# Patient Record
Sex: Female | Born: 1965 | Race: White | Hispanic: No | Marital: Married | Smoking: Current every day smoker
Health system: Southern US, Community
[De-identification: ages and names within clinical notes are randomized; demographics above are authoritative.]

## PROBLEM LIST (undated history)

## (undated) DIAGNOSIS — J339 Nasal polyp, unspecified: Secondary | ICD-10-CM

## (undated) DIAGNOSIS — H409 Unspecified glaucoma: Secondary | ICD-10-CM

## (undated) HISTORY — DX: Unspecified glaucoma: H40.9

## (undated) HISTORY — PX: OTHER SURGICAL HISTORY: SHX169

## (undated) HISTORY — DX: Nasal polyp, unspecified: J33.9

---

## 2010-02-14 ENCOUNTER — Ambulatory Visit: Payer: Self-pay

## 2010-02-20 ENCOUNTER — Ambulatory Visit: Payer: Self-pay

## 2014-04-27 ENCOUNTER — Ambulatory Visit: Payer: 59 | Admitting: Podiatry

## 2014-04-27 ENCOUNTER — Encounter: Payer: Self-pay | Admitting: Podiatry

## 2014-04-27 VITALS — BP 123/73 | HR 60 | Resp 16 | Ht 66.0 in | Wt 189.0 lb

## 2014-04-27 DIAGNOSIS — M79609 Pain in unspecified limb: Secondary | ICD-10-CM

## 2014-04-27 DIAGNOSIS — B079 Viral wart, unspecified: Secondary | ICD-10-CM

## 2014-04-27 DIAGNOSIS — Q828 Other specified congenital malformations of skin: Secondary | ICD-10-CM

## 2014-04-27 NOTE — Progress Notes (Signed)
   Subjective:    Patient ID: Kristina Hall, female    DOB: May 05, 1966, 48 y.o.   MRN: [id number]  HPI Comments: i have a lump on my left foot that has been there since oct 2014 and another lump on my rt foot that i just noticed. The left one hurts and the right one does not. The rt one has remained the same. The left one is getting worse. The left one hurts and stings. i put dr scholls wart remover on it.   Foot Pain      Review of Systems  HENT: Positive for sinus pressure.   Allergic/Immunologic: Positive for environmental allergies.  All other systems reviewed and are negative.      Objective:   Physical Exam        Assessment & Plan:

## 2014-04-27 NOTE — Patient Instructions (Signed)

## 2014-04-28 NOTE — Progress Notes (Signed)
Subjective:     Patient ID: Kristina Hall, female   DOB: 07-Mar-1966, 48 y.o.   MRN: [id number]  Foot Pain   patient presents with a painful lump on the outside of the left foot that she states she's tried to trim and pad without relief and a small and on the right is not bothering her currently   Review of Systems  All other systems reviewed and are negative.      Objective:   Physical Exam  Nursing note and vitals reviewed. Constitutional: She is oriented to person, place, and time.  Cardiovascular: Intact distal pulses.   Musculoskeletal: Normal range of motion.  Neurological: She is oriented to person, place, and time.  Skin: Skin is warm.   neurovascular status intact with muscle strength adequate and patient found to have lesion on the outside of the left foot that upon debridement shows pinpoint bleeding and also a lucent core. The right one was very small and was unable to identify any pathology. 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 She understands this and today I infiltrated 60 mg Xylocaine with epinephrine did sterile prep to the area remove the lesion in toto and placed in formalin for pathological evaluation and applied phenol to the base. It did measure about 1.2 cm x 8 mm and was painful when pressed applied sterile dressing and begin soaks

## 2014-06-26 ENCOUNTER — Encounter: Payer: Self-pay | Admitting: Podiatry

## 2014-07-08 ENCOUNTER — Emergency Department: Payer: Self-pay | Admitting: Emergency Medicine

## 2014-07-08 LAB — URINALYSIS, COMPLETE
Bacteria: NONE SEEN
Bilirubin,UR: NEGATIVE
Blood: NEGATIVE
GLUCOSE, UR: NEGATIVE mg/dL (ref 0–75)
Ketone: NEGATIVE
Leukocyte Esterase: NEGATIVE
NITRITE: NEGATIVE
PROTEIN: NEGATIVE
Ph: 6 (ref 4.5–8.0)
RBC,UR: NONE SEEN /HPF (ref 0–5)
SPECIFIC GRAVITY: 1.003 (ref 1.003–1.030)
WBC UR: <1 /HPF (ref 0–5)

## 2014-07-08 LAB — CBC
HCT: 47.8 % — ABNORMAL HIGH (ref 35.0–47.0)
HGB: 15.5 g/dL (ref 12.0–16.0)
MCH: 30.6 pg (ref 26.0–34.0)
MCHC: 32.4 g/dL (ref 32.0–36.0)
MCV: 95 fL (ref 80–100)
PLATELETS: 237 10*3/uL (ref 150–440)
RBC: 5.05 10*6/uL (ref 3.80–5.20)
RDW: 12.8 % (ref 11.5–14.5)
WBC: 8.7 10*3/uL (ref 3.6–11.0)

## 2014-07-08 LAB — COMPREHENSIVE METABOLIC PANEL
ALBUMIN: 3.6 g/dL (ref 3.4–5.0)
ALK PHOS: 78 U/L
Anion Gap: 6 — ABNORMAL LOW (ref 7–16)
BILIRUBIN TOTAL: 0.5 mg/dL (ref 0.2–1.0)
BUN: 17 mg/dL (ref 7–18)
CALCIUM: 9.1 mg/dL (ref 8.5–10.1)
Chloride: 111 mmol/L — ABNORMAL HIGH (ref 98–107)
Co2: 24 mmol/L (ref 21–32)
Creatinine: 0.75 mg/dL (ref 0.60–1.30)
EGFR (Non-African Amer.): >60
Glucose: 89 mg/dL (ref 65–99)
Osmolality: 282 (ref 275–301)
POTASSIUM: 4.1 mmol/L (ref 3.5–5.1)
SGOT(AST): 31 U/L (ref 15–37)
SGPT (ALT): 17 U/L
SODIUM: 141 mmol/L (ref 136–145)
Total Protein: 7.4 g/dL (ref 6.4–8.2)

## 2014-07-08 LAB — LIPASE, BLOOD: LIPASE: 164 U/L (ref 73–393)

## 2014-07-11 ENCOUNTER — Ambulatory Visit: Payer: Self-pay | Admitting: Family Medicine

## 2014-07-18 ENCOUNTER — Encounter: Payer: Self-pay | Admitting: *Deleted

## 2014-07-24 ENCOUNTER — Ambulatory Visit: Payer: 59 | Admitting: General Surgery

## 2014-10-02 ENCOUNTER — Ambulatory Visit (INDEPENDENT_AMBULATORY_CARE_PROVIDER_SITE_OTHER): Payer: 59 | Admitting: Podiatry

## 2014-10-02 VITALS — BP 130/71 | HR 71 | Resp 16

## 2014-10-02 DIAGNOSIS — B078 Other viral warts: Secondary | ICD-10-CM

## 2014-10-02 DIAGNOSIS — B079 Viral wart, unspecified: Secondary | ICD-10-CM

## 2014-10-02 DIAGNOSIS — M779 Enthesopathy, unspecified: Secondary | ICD-10-CM

## 2014-10-02 MED ORDER — TRIAMCINOLONE ACETONIDE 10 MG/ML IJ SUSP
10.0000 mg | Freq: Once | INTRAMUSCULAR | Status: AC
  Administered 2014-10-02: 10 mg

## 2014-10-03 NOTE — Progress Notes (Signed)
Subjective:     Patient ID: Kristina Hall, female   DOB: [birth year], 48 y.o.   MRN: [id number]  HPI patient has inflamed area on the lateral side of the left foot around the fifth metatarsal with keratotic lesion formation and history of having wart excised by me approximately 7 months ago. States it was very good for a few months and then that was reoccurrence   Review of Systems     Objective:   Physical Exam Neurovascular status intact muscle strength adequate with range of motion within normal limits. Patient is noted to have plantar keratotic lesion fifth metatarsal left with fluid buildup underneath the area and upon debridement there is slight pinpoint bleeding but it appears to be more of a waxy core like look    Assessment:     Inflammatory capsulitis left fifth MPJ with either verruca plantaris or possible scar tissue plantar aspect left fifth metatarsal    Plan:     Explain both conditions and today I did a fifth MPJ injection 3 mg dexamethasone Kenalog 5 mg Xylocaine and after appropriate numbness did deep debridement of lesions and applied chemical to kill any remaining verruca cells. Reappoint to recheck again in approximately 3 weeks

## 2014-10-19 ENCOUNTER — Ambulatory Visit (INDEPENDENT_AMBULATORY_CARE_PROVIDER_SITE_OTHER): Payer: 59 | Admitting: Podiatry

## 2014-10-19 ENCOUNTER — Encounter: Payer: Self-pay | Admitting: Podiatry

## 2014-10-19 VITALS — BP 122/72 | HR 68 | Resp 11

## 2014-10-19 DIAGNOSIS — B078 Other viral warts: Secondary | ICD-10-CM

## 2014-10-19 DIAGNOSIS — B079 Viral wart, unspecified: Secondary | ICD-10-CM

## 2014-10-20 NOTE — Progress Notes (Signed)
Subjective:     Patient ID: Kristina Hall, female   DOB: [birth year], 49 y.o.   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 Reappoint to recheck again if symptoms persist

## 2015-02-04 ENCOUNTER — Ambulatory Visit
Admission: RE | Admit: 2015-02-04 | Discharge: 2015-02-04 | Disposition: A | Discharge status: Home / Self Care | Payer: 59 | Attending: Family Medicine | Admitting: Family Medicine

## 2015-02-04 ENCOUNTER — Other Ambulatory Visit: Payer: Self-pay | Admitting: *Deleted

## 2015-02-04 ENCOUNTER — Ambulatory Visit
Admission: RE | Admit: 2015-02-04 | Discharge: 2015-02-04 | Disposition: A | Discharge status: Home / Self Care | Payer: 59 | Source: Ambulatory Visit | Attending: *Deleted | Admitting: *Deleted

## 2015-02-04 DIAGNOSIS — R52 Pain, unspecified: Secondary | ICD-10-CM

## 2015-02-04 DIAGNOSIS — M79644 Pain in right finger(s): Secondary | ICD-10-CM | POA: Insufficient documentation

## 2015-02-05 ENCOUNTER — Other Ambulatory Visit: Payer: Self-pay | Admitting: Family Medicine

## 2015-02-05 DIAGNOSIS — M79644 Pain in right finger(s): Secondary | ICD-10-CM

## 2015-07-26 ENCOUNTER — Ambulatory Visit (INDEPENDENT_AMBULATORY_CARE_PROVIDER_SITE_OTHER): Payer: 59 | Admitting: Family Medicine

## 2015-07-26 ENCOUNTER — Encounter: Payer: Self-pay | Admitting: Family Medicine

## 2015-07-26 VITALS — BP 102/60 | HR 72 | Temp 98.4°F | Resp 16 | Ht 65.0 in | Wt 186.6 lb

## 2015-07-26 DIAGNOSIS — J330 Polyp of nasal cavity: Secondary | ICD-10-CM

## 2015-07-26 DIAGNOSIS — J0121 Acute recurrent ethmoidal sinusitis: Secondary | ICD-10-CM | POA: Diagnosis not present

## 2015-07-26 MED ORDER — AMOXICILLIN-POT CLAVULANATE 875-125 MG PO TABS: 1.0000 | ORAL_TABLET | Freq: Two times a day (BID) | ORAL | Status: DC

## 2015-07-26 MED ORDER — METHYLPREDNISOLONE 4 MG PO TBPK: ORAL_TABLET | ORAL | Status: DC

## 2015-07-26 NOTE — Patient Instructions (Signed)
Follow up with ENT if not improving

## 2015-07-26 NOTE — Progress Notes (Signed)
Subjective:     Patient ID: Kristina Hall, female   DOB: [birth year], 49 y.o.   MRN: [id number]  HPI  Chief Complaint  Patient presents with  . Facial Pain    Patient comes in office today with concerns of sinus pain and pressure on the left side of her face for 1 week. Patient states that after last visit she saw ENT Dr. Tami Ribas who examined her and she was informed she had a polyp on the left side of her nose, she was prescribed a nasal spray to help with inflammation. Patient reports that she is having pain on the left side and feels stopped up she has also taken otc Advil Cold and Sinus and Mucinex D with no relief.   States she has increased dose of Q-Nasl to twice daily (as previously instructed by ENT) with little improvement. Unable to blow drainage out of that side. She is starting a job at [employer] and once she has sick days intends to have ENT surgery. Reports prior facial flushing/burn with prednisone.   Review of Systems  Constitutional: Negative for fever and chills.       Objective:   Physical Exam  Constitutional: She appears well-developed and well-nourished. No distress.  Ears: T.M's intact without inflammation Sinuses: left paranasal tenderness Throat: no tonsillar enlargement or exudate Neck: no cervical adenopathy Lungs: clear     Assessment:    1. Acute recurrent ethmoidal sinusitis - amoxicillin-clavulanate (AUGMENTIN) 875-125 MG tablet; Take 1 tablet by mouth 2 (two) times daily.  Dispense: 20 tablet; Refill: 0 - methylPREDNISolone (MEDROL DOSEPAK) 4 MG TBPK tablet; Taper daily as follows: 4 pills-4 pills-4 pills-3 pills-3 pills-2 pills-one pill  Dispense: 21 tablet; Refill: 1    Plan:    Refer to ENT if not improved.

## 2017-01-07 ENCOUNTER — Ambulatory Visit (INDEPENDENT_AMBULATORY_CARE_PROVIDER_SITE_OTHER): Payer: 59 | Admitting: Family Medicine

## 2017-01-07 ENCOUNTER — Encounter: Payer: Self-pay | Admitting: Family Medicine

## 2017-01-07 VITALS — BP 114/70 | HR 60 | Temp 98.3°F | Resp 16 | Wt 200.2 lb

## 2017-01-07 DIAGNOSIS — R002 Palpitations: Secondary | ICD-10-CM

## 2017-01-07 DIAGNOSIS — R42 Dizziness and giddiness: Secondary | ICD-10-CM | POA: Diagnosis not present

## 2017-01-07 LAB — EKG 12-LEAD

## 2017-01-07 NOTE — Progress Notes (Signed)
Subjective:     Patient ID: Kristina Hall, female   DOB: [birth year], 51 y.o.   MRN: [id number]  HPI  Chief Complaint  Patient presents with  . Palpitations    Patient comes in office today with concerns of heart palpitations for the past several weeks. Patient states in Feb. she began walking daily and she remembers a week and half ago after walking two miles she noticed palpitations and feeling of light headiness. Patient reports that on 3/27 she had a dull headache and dizziness at work, patient states that she was bending down to grab her purse amd when she stood dizziness occured. Patient denies chest pain associated with palpitations.   States she is trying to lose weight as she is sitting down all day at her job. Uses an exercise video. Minimal use of caffeine but smokes 1/2 ppd. She has recorded one episode of palpitations with a phone app. The rate ranged from 60's to 78 lasting a few seconds. States the beat is regular not irregular. No prior EKG's or Lipid profile.   Review of Systems     Objective:   Physical Exam  Constitutional: She appears well-developed and well-nourished. No distress.  Neck: Carotid bruit is not present. No thyromegaly present.  Cardiovascular: Normal rate and regular rhythm.   Pulmonary/Chest: Breath sounds normal.  Musculoskeletal: She exhibits no edema (of lower extremties).  Psychiatric:  Mild anxiety       Assessment:    1. Heart palpitations - EKG 12-Lead - Lipid panel - T4, free - TSH  2. Dizziness - CBC with Differential/Platelet - Comprehensive metabolic panel    Plan:    Further f/u pending lab work. Consider cardiology referral.

## 2017-01-07 NOTE — Patient Instructions (Addendum)
We will call you with the lab work. Calorie Counting for Weight Loss Calories are units of energy. Your body needs a certain amount of calories from food to keep you going throughout the day. When you eat more calories than your body needs, your body stores the extra calories as fat. When you eat fewer calories than your body needs, your body burns fat to get the energy it needs. Calorie counting means keeping track of how many calories you eat and drink each day. Calorie counting can be helpful if you need to lose weight. If you make sure to eat fewer calories than your body needs, you should lose weight. Ask your health care provider what a healthy weight is for you. For calorie counting to work, you will need to eat the right number of calories in a day in order to lose a healthy amount of weight per week. A dietitian can help you determine how many calories you need in a day and will give you suggestions on how to reach your calorie goal.  A healthy amount of weight to lose per week is usually 1-2 lb (0.5-0.9 kg). This usually means that your daily calorie intake should be reduced by 500-750 calories.  Eating 1,200 - 1,500 calories per day can help most women lose weight.  Eating 1,500 - 1,800 calories per day can help most men lose weight. What is my plan? My goal is to have __________ calories per day. If I have this many calories per day, I should lose around __________ pounds per week. What do I need to know about calorie counting? In order to meet your daily calorie goal, you will need to:  Find out how many calories are in each food you would like to eat. Try to do this before you eat.  Decide how much of the food you plan to eat.  Write down what you ate and how many calories it had. Doing this is called keeping a food log. To successfully lose weight, it is important to balance calorie counting with a healthy lifestyle that includes regular activity. Aim for 150 minutes of moderate  exercise (such as walking) or 75 minutes of vigorous exercise (such as running) each week. Where do I find calorie information?   The number of calories in a food can be found on a Nutrition Facts label. If a food does not have a Nutrition Facts label, try to look up the calories online or ask your dietitian for help. Remember that calories are listed per serving. If you choose to have more than one serving of a food, you will have to multiply the calories per serving by the amount of servings you plan to eat. For example, the label on a package of bread might say that a serving size is 1 slice and that there are 90 calories in a serving. If you eat 1 slice, you will have eaten 90 calories. If you eat 2 slices, you will have eaten 180 calories. How do I keep a food log? Immediately after each meal, record the following information in your food log:  What you ate. Don't forget to include toppings, sauces, and other extras on the food.  How much you ate. This can be measured in cups, ounces, or number of items.  How many calories each food and drink had.  The total number of calories in the meal. Keep your food log near you, such as in a small notebook in your pocket, or use  a mobile app or website. Some programs will calculate calories for you and show you how many calories you have left for the day to meet your goal. What are some calorie counting tips?  Use your calories on foods and drinks that will fill you up and not leave you hungry:  Some examples of foods that fill you up are nuts and nut butters, vegetables, lean proteins, and high-fiber foods like whole grains. High-fiber foods are foods with more than 5 g fiber per serving.  Drinks such as sodas, specialty coffee drinks, alcohol, and juices have a lot of calories, yet do not fill you up.  Eat nutritious foods and avoid empty calories. Empty calories are calories you get from foods or beverages that do not have many vitamins or  protein, such as candy, sweets, and soda. It is better to have a nutritious high-calorie food (such as an avocado) than a food with few nutrients (such as a bag of chips).  Know how many calories are in the foods you eat most often. This will help you calculate calorie counts faster.  Pay attention to calories in drinks. Low-calorie drinks include water and unsweetened drinks.  Pay attention to nutrition labels for "low fat" or "fat free" foods. These foods sometimes have the same amount of calories or more calories than the full fat versions. They also often have added sugar, starch, or salt, to make up for flavor that was removed with the fat.  Find a way of tracking calories that works for you. Get creative. Try different apps or programs if writing down calories does not work for you. What are some portion control tips?  Know how many calories are in a serving. This will help you know how many servings of a certain food you can have.  Use a measuring cup to measure serving sizes. You could also try weighing out portions on a kitchen scale. With time, you will be able to estimate serving sizes for some foods.  Take some time to put servings of different foods on your favorite plates, bowls, and cups so you know what a serving looks like.  Try not to eat straight from a bag or box. Doing this can lead to overeating. Put the amount you would like to eat in a cup or on a plate to make sure you are eating the right portion.  Use smaller plates, glasses, and bowls to prevent overeating.  Try not to multitask (for example, watch TV or use your computer) while eating. If it is time to eat, sit down at a table and enjoy your food. This will help you to know when you are full. It will also help you to be aware of what you are eating and how much you are eating. What are tips for following this plan? Reading food labels   Check the calorie count compared to the serving size. The serving size may be  smaller than what you are used to eating.  Check the source of the calories. Make sure the food you are eating is high in vitamins and protein and low in saturated and trans fats. Shopping   Read nutrition labels while you shop. This will help you make healthy decisions before you decide to purchase your food.  Make a grocery list and stick to it. Cooking   Try to cook your favorite foods in a healthier way. For example, try baking instead of frying.  Use low-fat dairy products. Meal planning  Use more fruits and vegetables. Half of your plate should be fruits and vegetables.  Include lean proteins like poultry and fish. How do I count calories when eating out?  Ask for smaller portion sizes.  Consider sharing an entree and sides instead of getting your own entree.  If you get your own entree, eat only half. Ask for a box at the beginning of your meal and put the rest of your entree in it so you are not tempted to eat it.  If calories are listed on the menu, choose the lower calorie options.  Choose dishes that include vegetables, fruits, whole grains, low-fat dairy products, and lean protein.  Choose items that are boiled, broiled, grilled, or steamed. Stay away from items that are buttered, battered, fried, or served with cream sauce. Items labeled "crispy" are usually fried, unless stated otherwise.  Choose water, low-fat milk, unsweetened iced tea, or other drinks without added sugar. If you want an alcoholic beverage, choose a lower calorie option such as a glass of wine or light beer.  Ask for dressings, sauces, and syrups on the side. These are usually high in calories, so you should limit the amount you eat.  If you want a salad, choose a garden salad and ask for grilled meats. Avoid extra toppings like bacon, cheese, or fried items. Ask for the dressing on the side, or ask for olive oil and vinegar or lemon to use as dressing.  Estimate how many servings of a food you  are given. For example, a serving of cooked rice is  cup or about the size of half a baseball. Knowing serving sizes will help you be aware of how much food you are eating at restaurants. The list below tells you how big or small some common portion sizes are based on everyday objects:  1 oz-4 stacked dice.  3 oz-1 deck of cards.  1 tsp-1 die.  1 Tbsp- a ping-pong ball.  2 Tbsp-1 ping-pong ball.   cup- baseball.  1 cup-1 baseball. Summary  Calorie counting means keeping track of how many calories you eat and drink each day. If you eat fewer calories than your body needs, you should lose weight.  A healthy amount of weight to lose per week is usually 1-2 lb (0.5-0.9 kg). This usually means reducing your daily calorie intake by 500-750 calories.  The number of calories in a food can be found on a Nutrition Facts label. If a food does not have a Nutrition Facts label, try to look up the calories online or ask your dietitian for help.  Use your calories on foods and drinks that will fill you up, and not on foods and drinks that will leave you hungry.  Use smaller plates, glasses, and bowls to prevent overeating. This information is not intended to replace advice given to you by your health care provider. Make sure you discuss any questions you have with your health care provider. Document Released: 09/21/2005 Document Revised: 08/21/2016 Document Reviewed: 08/21/2016 Elsevier Interactive Patient Education  2017 [employer].

## 2017-01-08 ENCOUNTER — Other Ambulatory Visit (INDEPENDENT_AMBULATORY_CARE_PROVIDER_SITE_OTHER): Payer: 59

## 2017-01-08 ENCOUNTER — Other Ambulatory Visit: Payer: Self-pay

## 2017-01-08 DIAGNOSIS — R002 Palpitations: Secondary | ICD-10-CM

## 2017-01-08 DIAGNOSIS — R42 Dizziness and giddiness: Secondary | ICD-10-CM

## 2017-01-09 LAB — COMPREHENSIVE METABOLIC PANEL
A/G RATIO: 2 (ref 1.2–2.2)
ALBUMIN: 4.5 g/dL (ref 3.5–5.5)
ALT: 9 IU/L (ref 0–32)
AST: 16 IU/L (ref 0–40)
Alkaline Phosphatase: 83 IU/L (ref 39–117)
BUN/Creatinine Ratio: 20 (ref 9–23)
BUN: 17 mg/dL (ref 6–24)
Bilirubin Total: 0.4 mg/dL (ref 0.0–1.2)
CALCIUM: 9.5 mg/dL (ref 8.7–10.2)
CO2: 24 mmol/L (ref 18–29)
CREATININE: 0.84 mg/dL (ref 0.57–1.00)
Chloride: 100 mmol/L (ref 96–106)
GFR, EST AFRICAN AMERICAN: 94 mL/min/{1.73_m2} (ref >59)
GFR, EST NON AFRICAN AMERICAN: 81 mL/min/{1.73_m2} (ref >59)
GLOBULIN, TOTAL: 2.3 g/dL (ref 1.5–4.5)
Glucose: 97 mg/dL (ref 65–99)
POTASSIUM: 4.2 mmol/L (ref 3.5–5.2)
Sodium: 142 mmol/L (ref 134–144)
TOTAL PROTEIN: 6.8 g/dL (ref 6.0–8.5)

## 2017-01-09 LAB — CBC WITH DIFFERENTIAL/PLATELET
Basophils Absolute: 0 10*3/uL (ref 0.0–0.2)
Basos: 0 %
EOS (ABSOLUTE): 0.1 10*3/uL (ref 0.0–0.4)
EOS: 1 %
HEMOGLOBIN: 15.3 g/dL (ref 11.1–15.9)
Hematocrit: 45 % (ref 34.0–46.6)
IMMATURE GRANS (ABS): 0 10*3/uL (ref 0.0–0.1)
IMMATURE GRANULOCYTES: 0 %
LYMPHS: 26 %
Lymphocytes Absolute: 2 10*3/uL (ref 0.7–3.1)
MCH: 30.7 pg (ref 26.6–33.0)
MCHC: 34 g/dL (ref 31.5–35.7)
MCV: 90 fL (ref 79–97)
MONOCYTES: 8 %
Monocytes Absolute: 0.6 10*3/uL (ref 0.1–0.9)
NEUTROS PCT: 65 %
Neutrophils Absolute: 5 10*3/uL (ref 1.4–7.0)
PLATELETS: 235 10*3/uL (ref 150–379)
RBC: 4.98 x10E6/uL (ref 3.77–5.28)
RDW: 12.4 % (ref 12.3–15.4)
WBC: 7.7 10*3/uL (ref 3.4–10.8)

## 2017-01-09 LAB — T4, FREE: Free T4: 1.32 ng/dL (ref 0.82–1.77)

## 2017-01-09 LAB — TSH: TSH: 2.34 u[IU]/mL (ref 0.450–4.500)

## 2017-01-09 LAB — LIPID PANEL
CHOLESTEROL TOTAL: 184 mg/dL (ref 100–199)
Chol/HDL Ratio: 2.6 ratio (ref 0.0–4.4)
HDL: 72 mg/dL (ref >39)
LDL CALC: 102 mg/dL — AB (ref 0–99)
Triglycerides: 48 mg/dL (ref 0–149)
VLDL CHOLESTEROL CAL: 10 mg/dL (ref 5–40)

## 2017-08-27 ENCOUNTER — Ambulatory Visit: Payer: 59 | Admitting: Family Medicine

## 2017-08-27 ENCOUNTER — Encounter: Payer: Self-pay | Admitting: Family Medicine

## 2017-08-27 VITALS — BP 110/80 | HR 62 | Temp 98.1°F | Resp 16 | Wt 197.0 lb

## 2017-08-27 DIAGNOSIS — R0981 Nasal congestion: Secondary | ICD-10-CM | POA: Diagnosis not present

## 2017-08-27 DIAGNOSIS — J330 Polyp of nasal cavity: Secondary | ICD-10-CM | POA: Diagnosis not present

## 2017-08-27 MED ORDER — BECLOMETHASONE DIPROPIONATE 80 MCG/ACT NA AERS: 2.0000 | INHALATION_SPRAY | NASAL | 5 refills | Status: DC | PRN

## 2017-08-27 MED ORDER — METHYLPREDNISOLONE 4 MG PO TABS: ORAL_TABLET | ORAL | 1 refills | Status: DC

## 2017-08-27 NOTE — Progress Notes (Signed)
Subjective:     Patient ID: Kristina Hall, female   DOB: April 28, 1966, 51 y.o.   MRN: [id number] Chief Complaint  Patient presents with  . Nasal Congestion    Patient here today C/O nasl congestion, pt reports she does have a history of nasal polyp. Patient reports she has been seen at ENT.   Reports transient relief of left nasal obstruction with Tea Tree oil. Has run out of steroid nasal spray. Reports drainage is clear. Continues to smoke 0.5 ppd. HPI   Review of Systems     Objective:   Physical Exam  Constitutional: She appears well-developed and well-nourished. No distress.  HENT:  Left nasal turbinates swollen with clear drainage noted.       Assessment:    1. Nasal polyp, posterior  2. Nasal sinus congestion: refilled steroid nasal spray and Medrol dose pak.    Plan:    Call if needs referral back to ENT.

## 2017-08-27 NOTE — Patient Instructions (Signed)
Let me know if you need referral back to Dr. Tami Ribas.

## 2017-09-01 ENCOUNTER — Telehealth: Payer: Self-pay | Admitting: Family Medicine

## 2017-09-01 ENCOUNTER — Other Ambulatory Visit: Payer: Self-pay | Admitting: Family Medicine

## 2017-09-01 DIAGNOSIS — J329 Chronic sinusitis, unspecified: Secondary | ICD-10-CM

## 2017-09-01 MED ORDER — AMOXICILLIN-POT CLAVULANATE 875-125 MG PO TABS: 1.0000 | ORAL_TABLET | Freq: Two times a day (BID) | ORAL | 0 refills | Status: DC

## 2017-09-01 NOTE — Telephone Encounter (Signed)
Please review note and advise. KW

## 2017-09-01 NOTE — Telephone Encounter (Signed)
Pt states she seen Mikki Santee on Friday.  Pt states she is now having thick yellow/green mucus coming from the left side of her nose and having some pain.  Pt is also some chest congestion and a wet cough.  Pt states she does not need a cough medication but is requesting an antibiotic to help with the mucus [facility].  CB#[phone]/MW

## 2017-09-01 NOTE — Telephone Encounter (Signed)
Left message advising patient.KW

## 2017-09-01 NOTE — Telephone Encounter (Signed)
Let her know I sent in Augmentin.

## 2018-01-14 ENCOUNTER — Other Ambulatory Visit: Payer: Self-pay | Admitting: Family Medicine

## 2018-01-14 DIAGNOSIS — J330 Polyp of nasal cavity: Secondary | ICD-10-CM

## 2018-01-14 MED ORDER — FLUTICASONE PROPIONATE 50 MCG/ACT NA SUSP: NASAL | 6 refills | Status: DC

## 2018-09-26 ENCOUNTER — Telehealth: Payer: Self-pay | Admitting: Family Medicine

## 2018-09-26 NOTE — Telephone Encounter (Signed)
[facility] faxed refill request for the following medications:  QNASL 80 MCG/ACT AERS   Last dispensed:  08/25/2018  Date written: 08/27/2017  Please advise.

## 2018-09-27 ENCOUNTER — Other Ambulatory Visit: Payer: Self-pay | Admitting: Family Medicine

## 2018-09-27 MED ORDER — BECLOMETHASONE DIPROPIONATE 80 MCG/ACT NA AERS: INHALATION_SPRAY | NASAL | 5 refills | Status: DC

## 2018-09-27 NOTE — Telephone Encounter (Signed)
Sent in. Previously using fluticasone

## 2018-09-27 NOTE — Telephone Encounter (Signed)
Please review.  dbs 

## 2019-03-10 ENCOUNTER — Ambulatory Visit (INDEPENDENT_AMBULATORY_CARE_PROVIDER_SITE_OTHER): Payer: 59 | Admitting: Family Medicine

## 2019-03-10 ENCOUNTER — Other Ambulatory Visit: Payer: Self-pay

## 2019-03-10 ENCOUNTER — Encounter: Payer: Self-pay | Admitting: Family Medicine

## 2019-03-10 VITALS — BP 118/68 | HR 69 | Temp 98.2°F | Resp 18

## 2019-03-10 DIAGNOSIS — L237 Allergic contact dermatitis due to plants, except food: Secondary | ICD-10-CM | POA: Diagnosis not present

## 2019-03-10 MED ORDER — METHYLPREDNISOLONE ACETATE 40 MG/ML IJ SUSP
40.0000 mg | Freq: Once | INTRAMUSCULAR | Status: AC
  Administered 2019-03-10: 40 mg via INTRAMUSCULAR

## 2019-03-10 MED ORDER — FAMOTIDINE 40 MG PO TABS: 40.0000 mg | ORAL_TABLET | Freq: Every day | ORAL | 0 refills | Status: DC

## 2019-03-10 MED ORDER — PREDNISONE 10 MG (21) PO TBPK: ORAL_TABLET | ORAL | 0 refills | Status: DC

## 2019-03-10 MED ORDER — METHYLPREDNISOLONE ACETATE 40 MG/ML IJ SUSP
40.0000 mg | Freq: Once | INTRAMUSCULAR | Status: AC
  Administered 2019-03-10: 12:00:00 40 mg via INTRAMUSCULAR

## 2019-03-10 MED ORDER — TRIAMCINOLONE ACETONIDE 0.1 % EX CREA: 1.0000 "application " | TOPICAL_CREAM | Freq: Two times a day (BID) | CUTANEOUS | 0 refills | Status: DC

## 2019-03-10 NOTE — Patient Instructions (Signed)
Also take a 10 mg claritin (loratadine) in the AM to help complete histamine blockade in combo with pepcid at bedtime   Poison Ivy Dermatitis  Poison ivy dermatitis is redness and soreness (inflammation) of the skin. It is caused by a chemical that is found on the leaves of the poison ivy plant. You may also have itching, a rash, and blisters. Symptoms often clear up in 1-2 weeks. You may get this condition by touching a poison ivy plant. You can also get it by touching something that has the chemical on it. This may include animals or objects that have come in contact with the plant. Follow these instructions at home: General instructions  Take or apply over-the-counter and prescription medicines only as told by your doctor.  If you touch poison ivy, wash your skin with soap and cold water right away.  Use hydrocortisone creams or calamine lotion as needed to help with itching.  Take oatmeal baths as needed. Use colloidal oatmeal. You can get this at a pharmacy or grocery store. Follow the instructions on the package.  Do not scratch or rub your skin.  While you have the rash, wash your clothes right after you wear them. Prevention   Know what poison ivy looks like so you can avoid it. This plant has three leaves with flowering branches on a single stem. The leaves are glossy. They have uneven edges that come to a point at the front.  If you have touched poison ivy, wash with soap and water right away. Be sure to wash under your fingernails.  When hiking or camping, wear long pants, a long-sleeved shirt, tall socks, and hiking boots. You can also use a lotion on your skin that helps to prevent contact with the chemical on the plant.  If you think that your clothes or outdoor gear came in contact with poison ivy, rinse them off with a garden hose before you bring them inside your house. Contact a doctor if:  You have open sores in the rash area.  You have more redness, swelling, or  pain in the affected area.  You have redness that spreads beyond the rash area.  You have fluid, blood, or pus coming from the affected area.  You have a fever.  You have a rash over a large area of your body.  You have a rash on your eyes, mouth, or genitals.  Your rash does not get better after a few days. Get help right away if:  Your face swells or your eyes swell shut.  You have trouble breathing.  You have trouble swallowing. This information is not intended to replace advice given to you by your health care provider. Make sure you discuss any questions you have with your health care provider. Document Released: 10/24/2010 Document Revised: 06/15/2018 Document Reviewed: 02/27/2015 Elsevier Interactive Patient Education  2019 [employer].

## 2019-03-10 NOTE — Progress Notes (Signed)
Subjective:    Patient ID: Kristina Hall, female    DOB: 04-04-1966, 53 y.o.   MRN: [id number]  HPI   Patient presents to clinic to establish with PCP and also complains of a rash that she suspects is from poison ivy.  Patient's previous PCP is retiring.  She does not take any medications chronically and has no major medical issues.  Patient was outdoors doing yard work and believes that she did come into contact with poison ivy.  States it was hot and does remember wiping her face that this is why she believes she has a poison ivy rash on her face as well as on her hands and ankles.  States the rash is very itchy and seems to be spreading.  Patient did take Benadryl last night & used calamine lotion with some effect in improving rash, but it is still very itchy and seeming to spread.  Patient Active Problem List   Diagnosis Date Noted  . Nasal polyp, posterior 07/26/2015   Social History   Tobacco Use  . Smoking status: Current Every Day Smoker  . Smokeless tobacco: Never Used  Substance Use Topics  . Alcohol use: No    Review of Systems  Constitutional: Negative for chills, fatigue and fever.  HENT: Negative for congestion, ear pain, sinus pain and sore throat.   Eyes: Negative.   Respiratory: Negative for cough, shortness of breath and wheezing.   Cardiovascular: Negative for chest pain, palpitations and leg swelling.  Gastrointestinal: Negative for abdominal pain, diarrhea, nausea and vomiting.  Genitourinary: Negative for dysuria, frequency and urgency.  Musculoskeletal: Negative for arthralgias and myalgias.  Skin: +rash on ankles, wrists and chin Neurological: Negative for syncope, light-headedness and headaches.  Psychiatric/Behavioral: The patient is not nervous/anxious.       Objective:   Physical Exam Vitals signs and nursing note reviewed.  Constitutional:      General: She is not in acute distress.    Appearance: She is not ill-appearing,  toxic-appearing or diaphoretic.  HENT:     Head: Normocephalic and atraumatic.  Neck:     Musculoskeletal: Normal range of motion and neck supple. No neck rigidity.  Cardiovascular:     Rate and Rhythm: Normal rate and regular rhythm.  Pulmonary:     Effort: Pulmonary effort is normal. No respiratory distress.     Breath sounds: Normal breath sounds.  Skin:    General: Skin is warm and dry.     Findings: Rash (Scattered rash along jawline consistent with patient wiping face, rash on wrists/forearms and also both ankles.  Rash appears red and raised with some dry flaking skin patches and does look consistent with a poison ivy contact dermatitis.) present.  Neurological:     General: No focal deficit present.     Mental Status: She is alert and oriented to person, place, and time.  Psychiatric:        Mood and Affect: Mood normal.        Behavior: Behavior normal.    Vitals:   03/10/19 1100  BP: 118/68  Pulse: 69  Resp: 18  Temp: 98.2 F (36.8 C)     Assessment & Plan:    Contact dermatitis due to poison ivy-patient will take oral steroid taper.  She will use topical triamcinolone cream.  She will also do histamine blockade by taking Claritin 10 mg daily in the a.m. and Pepcid 40 mg in the p.m. to help reduce itching.  Patient also given  IM methylprednisolone in the clinic to help jump start steroid dosing in hopes of calming down inflammation and rash.  Administrations This Visit    methylPREDNISolone acetate (DEPO-MEDROL) injection 40 mg    Admin Date 03/10/2019 Action Given Dose 40 mg Route Intramuscular Administered By Neta Ehlers, RMA        Admin Date 03/10/2019 Action Given Dose 40 mg Route Intramuscular Administered By Neta Ehlers, RMA          Patient will schedule complete physical exam at some point this calendar year.  She is aware she can return to clinic anytime if issues arise.

## 2019-10-25 ENCOUNTER — Encounter: Payer: Self-pay | Admitting: Emergency Medicine

## 2019-10-25 ENCOUNTER — Other Ambulatory Visit: Payer: Self-pay

## 2019-10-25 ENCOUNTER — Emergency Department: Payer: 59

## 2019-10-25 ENCOUNTER — Emergency Department
Admission: EM | Admit: 2019-10-25 | Discharge: 2019-10-25 | Disposition: A | Discharge status: Home / Self Care | Payer: 59 | Attending: Emergency Medicine | Admitting: Emergency Medicine

## 2019-10-25 DIAGNOSIS — R42 Dizziness and giddiness: Secondary | ICD-10-CM | POA: Diagnosis not present

## 2019-10-25 DIAGNOSIS — Z5321 Procedure and treatment not carried out due to patient leaving prior to being seen by health care provider: Secondary | ICD-10-CM | POA: Diagnosis not present

## 2019-10-25 LAB — BASIC METABOLIC PANEL
Anion gap: 9 (ref 5–15)
BUN: 18 mg/dL (ref 6–20)
CO2: 26 mmol/L (ref 22–32)
Calcium: 9.1 mg/dL (ref 8.9–10.3)
Chloride: 106 mmol/L (ref 98–111)
Creatinine, Ser: 0.73 mg/dL (ref 0.44–1.00)
GFR calc Af Amer: >60 mL/min (ref >60)
GFR calc non Af Amer: >60 mL/min (ref >60)
Glucose, Bld: 92 mg/dL (ref 70–99)
Potassium: 3.7 mmol/L (ref 3.5–5.1)
Sodium: 141 mmol/L (ref 135–145)

## 2019-10-25 LAB — CBC
HCT: 47.2 % — ABNORMAL HIGH (ref 36.0–46.0)
Hemoglobin: 15.6 g/dL — ABNORMAL HIGH (ref 12.0–15.0)
MCH: 30.1 pg (ref 26.0–34.0)
MCHC: 33.1 g/dL (ref 30.0–36.0)
MCV: 90.9 fL (ref 80.0–100.0)
Platelets: 225 10*3/uL (ref 150–400)
RBC: 5.19 MIL/uL — ABNORMAL HIGH (ref 3.87–5.11)
RDW: 12.4 % (ref 11.5–15.5)
WBC: 7.4 10*3/uL (ref 4.0–10.5)
nRBC: 0 % (ref 0.0–0.2)

## 2019-10-25 LAB — TROPONIN I (HIGH SENSITIVITY): Troponin I (High Sensitivity): <2 ng/L (ref <18)

## 2019-10-25 NOTE — ED Triage Notes (Signed)
Patient reports she was at work this morning, and got very light headed and had trouble breathing through her mask. 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 Patient denies CP, states chest feels heavy and she is dizzy.

## 2019-10-26 ENCOUNTER — Telehealth: Payer: Self-pay | Admitting: Emergency Medicine

## 2019-10-26 NOTE — Telephone Encounter (Signed)
Called patient due to lwot to inquire about condition and follow up plans. Left message.   

## 2019-10-31 ENCOUNTER — Ambulatory Visit: Payer: 59 | Admitting: Internal Medicine

## 2019-11-07 ENCOUNTER — Ambulatory Visit (INDEPENDENT_AMBULATORY_CARE_PROVIDER_SITE_OTHER): Payer: 59 | Admitting: Internal Medicine

## 2019-11-07 ENCOUNTER — Encounter: Payer: Self-pay | Admitting: Internal Medicine

## 2019-11-07 ENCOUNTER — Other Ambulatory Visit: Payer: Self-pay

## 2019-11-07 VITALS — BP 122/70 | HR 64 | Resp 14 | Ht 66.0 in | Wt 199.0 lb

## 2019-11-07 DIAGNOSIS — D751 Secondary polycythemia: Secondary | ICD-10-CM | POA: Diagnosis not present

## 2019-11-07 DIAGNOSIS — R001 Bradycardia, unspecified: Secondary | ICD-10-CM | POA: Diagnosis not present

## 2019-11-07 DIAGNOSIS — E785 Hyperlipidemia, unspecified: Secondary | ICD-10-CM

## 2019-11-07 NOTE — Progress Notes (Signed)
Subjective:  Patient ID: Kristina Hall, female    DOB: [birth year]  Age: 54 y.o. MRN: [id number]  CC: The primary encounter diagnosis was Bradycardia. Diagnoses of Erythrocytosis, Hyperlipidemia, unspecified hyperlipidemia type, and Sinus bradycardia were also pertinent to this visit.  HPI Kristina Hall presents for  ER follow up. She is a 54 yr old female with no cardiac history who presents with a recent ER visit for symptomatic sinus bradycardia  .  symptoms of light headedness  And ethmoidal headache   Foggy feeling,  Thought it was the mask , went outside to get some air ,  No change.  BP checked , pulse was 42 and irregular , rechecked and 36 Sent to ER.  Lytes oK,  Symptoms of light headedness lasted a few more days but progresively improved before esolving  Has been checking pulse at home average pulse is 56   Has reduced tobacco from 1/2 pack to 1/3 daily .  Motivated to quit bc she has a new grandson Has comitted to losign weight but right knee preventing more than walking.  1329 cal daily  coffee and water only intake. Has lost 3 lbs   left sided back pain radiates to buttock first episode in years.  Has been present for 2 days.  Worse at night   History of pulse 31 after hysterectomy    History Annya has a past medical history of Nasal polyp.   She has a past surgical history that includes supracervical hysterectomy.   Her family history includes Congestive Heart Failure in her paternal grandmother; Congestive Heart Failure (age of onset: 38) in her father; Coronary artery disease in her paternal uncle; Hypotension in her mother.She reports that she has been smoking. She has been smoking about 0.30 packs per day. She has never used smokeless tobacco. She reports current alcohol use of about 1.0 standard drinks of alcohol per week. No history on file for drug.  Outpatient Medications Prior to Visit  Medication Sig Dispense Refill  . amoxicillin-clavulanate  (AUGMENTIN) 875-125 MG tablet Take 1 tablet by mouth 2 (two) times daily. (Patient not taking: Reported on 11/07/2019) 20 tablet 0  . Beclomethasone Dipropionate 80 MCG/ACT AERS One spray each nostril daily (Patient not taking: Reported on 11/07/2019) 10.6 g 5  . famotidine (PEPCID) 40 MG tablet Take 1 tablet (40 mg total) by mouth at bedtime for 14 days. 14 tablet 0  . methylPREDNISolone (MEDROL) 4 MG tablet 4 pills x 3 days, 3 pills x 2 days, two pills x one day, one pill x one days 21 tablet 1  . predniSONE (STERAPRED UNI-PAK 21 TAB) 10 MG (21) TBPK tablet Take according to pack instructions 21 tablet 0  . triamcinolone cream (KENALOG) 0.1 % Apply 1 application topically 2 (two) times daily. Apply to affected rash areas 30 g 0  . VYZULTA 0.024 % SOLN INSTILL 1 DROP INTO BOTH EYES AT BEDTIME AS DIRECTED  7   No facility-administered medications prior to visit.    Review of Systems:  Patient denies headache, fevers, malaise, unintentional weight loss, skin rash, eye pain, sinus congestion and sinus pain, sore throat, dysphagia,  hemoptysis , cough, dyspnea, wheezing, chest pain, palpitations, orthopnea, edema, abdominal pain, nausea, melena, diarrhea, constipation, flank pain, dysuria, hematuria, urinary  Frequency, nocturia, numbness, tingling, seizures,  Focal weakness, Loss of consciousness,  Tremor, insomnia, depression, anxiety, and suicidal ideation.     Objective:  BP 122/70 (BP Location: Left Arm, Patient Position: Sitting, Cuff Size:  Normal)   Pulse 64   Resp 14   Ht 5\' 6"  (1.676 m)   Wt 199 lb (90.3 kg)   SpO2 99%   BMI 32.12 kg/m   Physical Exam:  General appearance: alert, cooperative and appears stated age Ears: normal TM's and external ear canals both ears Throat: lips, mucosa, and tongue normal; teeth and gums normal Neck: no adenopathy, no carotid bruit, supple, symmetrical, trachea midline and thyroid not enlarged, symmetric, no tenderness/mass/nodules Back: symmetric,  no curvature. ROM normal. No CVA tenderness. Lungs: clear to auscultation bilaterally Heart: bradycardic, regular rhythm, S1, S2 normal, no murmur, click, rub or gallop Abdomen: soft, non-tender; bowel sounds normal; no masses,  no organomegaly Pulses: 2+ and symmetric Skin: Skin color, texture, turgor normal. No rashes or lesions Lymph nodes: Cervical, supraclavicular, and axillary nodes normal.   Assessment & Plan:   Problem List Items Addressed This Visit      Unprioritized   Sinus bradycardia    Symptomatic episode resulting in light headedness .  EKG ruled out NSTEMI with one troponin but did not check thyroid function  Or magnesium , will check today and order cardiology evaluation with monitor       Relevant Orders   Ambulatory referral to Cardiology    Other Visit Diagnoses    Bradycardia    -  Primary   Relevant Orders   TSH   Magnesium   Erythrocytosis       Relevant Orders   CBC with Differential/Platelet   Hyperlipidemia, unspecified hyperlipidemia type       Relevant Orders   Lipid panel      I have discontinued Lydia Guiles. Hall's Vyzulta, methylPREDNISolone, amoxicillin-clavulanate, Beclomethasone Dipropionate, predniSONE, triamcinolone cream, and famotidine.  No orders of the defined types were placed in this encounter.   Medications Discontinued During This Encounter  Medication Reason  . amoxicillin-clavulanate (AUGMENTIN) 875-125 MG tablet Completed Course  . Beclomethasone Dipropionate 80 MCG/ACT AERS Patient has not taken in last 30 days  . famotidine (PEPCID) 40 MG tablet Patient has not taken in last 30 days  . methylPREDNISolone (MEDROL) 4 MG tablet Completed Course  . predniSONE (STERAPRED UNI-PAK 21 TAB) 10 MG (21) TBPK tablet Completed Course  . triamcinolone cream (KENALOG) 0.1 % Patient has not taken in last 30 days  . VYZULTA 0.024 % SOLN Patient has not taken in last 30 days   A total of 45 minutes was spent with patient more than half  of which was spent in counseling patient on the above mentioned issues , reviewing and explaining recent labs and imaging studies done, and coordination of care.  Follow-up: No follow-ups on file.   Crecencio Mc, MD

## 2019-11-07 NOTE — Assessment & Plan Note (Signed)
Symptomatic episode resulting in light headedness .  EKG ruled out NSTEMI with one troponin but did not check thyroid function  Or magnesium , will check today and order cardiology evaluation with monitor

## 2019-11-08 LAB — LIPID PANEL
Cholesterol: 186 mg/dL (ref 0–200)
HDL: 69 mg/dL (ref >39.00)
LDL Cholesterol: 110 mg/dL — ABNORMAL HIGH (ref 0–99)
NonHDL: 117.43
Total CHOL/HDL Ratio: 3
Triglycerides: 35 mg/dL (ref 0.0–149.0)
VLDL: 7 mg/dL (ref 0.0–40.0)

## 2019-11-08 LAB — CBC WITH DIFFERENTIAL/PLATELET
Basophils Absolute: 0.1 10*3/uL (ref 0.0–0.1)
Basophils Relative: 1.2 % (ref 0.0–3.0)
Eosinophils Absolute: 0.1 10*3/uL (ref 0.0–0.7)
Eosinophils Relative: 1 % (ref 0.0–5.0)
HCT: 45.4 % (ref 36.0–46.0)
Hemoglobin: 15.1 g/dL — ABNORMAL HIGH (ref 12.0–15.0)
Lymphocytes Relative: 27.8 % (ref 12.0–46.0)
Lymphs Abs: 1.8 10*3/uL (ref 0.7–4.0)
MCHC: 33.2 g/dL (ref 30.0–36.0)
MCV: 91.9 fl (ref 78.0–100.0)
Monocytes Absolute: 0.5 10*3/uL (ref 0.1–1.0)
Monocytes Relative: 7.2 % (ref 3.0–12.0)
Neutro Abs: 4 10*3/uL (ref 1.4–7.7)
Neutrophils Relative %: 62.8 % (ref 43.0–77.0)
Platelets: 205 10*3/uL (ref 150.0–400.0)
RBC: 4.94 Mil/uL (ref 3.87–5.11)
RDW: 13.2 % (ref 11.5–15.5)
WBC: 6.4 10*3/uL (ref 4.0–10.5)

## 2019-11-08 LAB — TSH: TSH: 2.09 u[IU]/mL (ref 0.35–4.50)

## 2019-11-08 LAB — MAGNESIUM: Magnesium: 2.3 mg/dL (ref 1.5–2.5)

## 2019-11-08 NOTE — Addendum Note (Signed)
Addended by: Leeanne Rio on: 11/08/2019 08:49 AM   Modules accepted: Orders

## 2019-11-09 ENCOUNTER — Other Ambulatory Visit: Payer: Self-pay

## 2019-11-09 ENCOUNTER — Ambulatory Visit (INDEPENDENT_AMBULATORY_CARE_PROVIDER_SITE_OTHER): Payer: 59 | Admitting: Cardiology

## 2019-11-09 ENCOUNTER — Encounter: Payer: Self-pay | Admitting: Cardiology

## 2019-11-09 ENCOUNTER — Ambulatory Visit (INDEPENDENT_AMBULATORY_CARE_PROVIDER_SITE_OTHER): Payer: 59

## 2019-11-09 VITALS — BP 128/70 | HR 53 | Ht 66.0 in | Wt 201.2 lb

## 2019-11-09 DIAGNOSIS — R001 Bradycardia, unspecified: Secondary | ICD-10-CM | POA: Diagnosis not present

## 2019-11-09 DIAGNOSIS — R009 Unspecified abnormalities of heart beat: Secondary | ICD-10-CM | POA: Diagnosis not present

## 2019-11-09 DIAGNOSIS — F17200 Nicotine dependence, unspecified, uncomplicated: Secondary | ICD-10-CM | POA: Diagnosis not present

## 2019-11-09 NOTE — Progress Notes (Signed)
Cardiology Office Note:    Date:  11/09/2019   ID:  Kristina Hall, DOB August 14, 1966, MRN [id number]  PCP:  Crecencio Mc, MD  Cardiologist:  No primary care provider on file.  Electrophysiologist:  None   Referring MD: Crecencio Mc, MD   Chief Complaint  Patient presents with  . New Patient (Initial Visit)    Referred by PCP for Low HR. Meds reviewed verbally with patient.    Kristina Hall is a 54 y.o. female who is being seen today for the evaluation of the bradycardia at the request of Derrel Nip, Aris Everts, MD.   History of Present Illness:    Kristina Hall is a 54 y.o. female current smoker, being seen due to low heart rates.  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  While at work roughly 2 to 3 weeks ago, she felt dizzy.  She attributed it to wearing a new mask due to the pandemic and was not breathing well.  Her blood pressure was checked in the office which was normal.  However her heart rate was noted to be 42.  A manual heart recheck revealed heart rate of 36 and pulse was felt to be irregular.  She was then advised to go to the emergency room.  EKG showed sinus bradycardia with heart rate 49, other labs and blood work including high-sensitivity troponin was normal.  She denies any chest pain, shortness of breath, palpitations, presyncope or syncope.  The episode of dizziness have since resolved.  She denies taking any AV nodal blocking agents.  She states having a low heart rate at baseline in the 50s.  Past Medical History:  Diagnosis Date  . Nasal polyp     Past Surgical History:  Procedure Laterality Date  . supracervical hysterectomy     due to menorrhagia     Current Medications: No outpatient medications have been marked as taking for the 11/09/19 encounter (Office Visit) with Kate Sable, MD.     Allergies:   Patient has no known allergies.   Social History   Socioeconomic History  . Marital status:  Married    Spouse name: Not on file  . Number of children: Not on file  . Years of education: Not on file  . Highest education level: Not on file  Occupational History  . Not on file  Tobacco Use  . Smoking status: Current Every Day Smoker    Packs/day: 0.25  . Smokeless tobacco: Never Used  . Tobacco comment: 6 ciggs a day  Substance and Sexual Activity  . Alcohol use: Yes    Alcohol/week: 1.0 standard drinks    Types: 1 Glasses of wine per week  . Drug use: Never  . Sexual activity: Not on file  Other Topics Concern  . Not on file  Social History Narrative   2 childrens aged 34 and 85, both in [place] married   Social Determinants of Radio broadcast assistant Strain:   . Difficulty of Paying Living Expenses: Not on file  Food Insecurity:   . Worried About Charity fundraiser in the Last Year: Not on file  . Ran Out of Food in the Last Year: Not on file  Transportation Needs:   . Lack of Transportation (Medical): Not on file  . Lack of Transportation (Non-Medical): Not on file  Physical Activity:   . Days of Exercise per Week: Not on file  . Minutes  of Exercise per Session: Not on file  Stress:   . Feeling of Stress : Not on file  Social Connections:   . Frequency of Communication with Friends and Family: Not on file  . Frequency of Social Gatherings with Friends and Family: Not on file  . Attends Religious Services: Not on file  . Active Member of Clubs or Organizations: Not on file  . Attends Archivist Meetings: Not on file  . Marital Status: Not on file     Family History: The patient's family history includes Congestive Heart Failure in her paternal grandmother; Congestive Heart Failure (age of onset: 21) in her father; Coronary artery disease in her paternal uncle; Hypotension in her mother.  ROS:   Please see the history of present illness.     All other systems reviewed and are negative.  EKGs/Labs/Other Studies Reviewed:    The  following studies were reviewed today:   EKG:  EKG is  ordered today.  The ekg ordered today demonstrates bradycardia, heart rate 53  Recent Labs: 10/25/2019: BUN 18; Creatinine, Ser 0.73; Potassium 3.7; Sodium 141 11/08/2019: Hemoglobin 15.1; Magnesium 2.3; Platelets 205.0; TSH 2.09  Recent Lipid Panel    Component Value Date/Time   CHOL 186 11/08/2019 0849   CHOL 184 01/08/2017 0000   TRIG 35.0 11/08/2019 0849   HDL 69.00 11/08/2019 0849   HDL 72 01/08/2017 0000   CHOLHDL 3 11/08/2019 0849   VLDL 7.0 11/08/2019 0849   LDLCALC 110 (H) 11/08/2019 0849   LDLCALC 102 (H) 01/08/2017 0000    Physical Exam:    VS:  BP 128/70 (BP Location: Right Arm, Patient Position: Sitting, Cuff Size: Normal)   Pulse (!) 53   Ht 5\' 6"  (1.676 m)   Wt 201 lb 4 oz (91.3 kg)   SpO2 98%   BMI 32.48 kg/m     Wt Readings from Last 3 Encounters:  11/09/19 201 lb 4 oz (91.3 kg)  11/07/19 199 lb (90.3 kg)  08/27/17 197 lb (89.4 kg)     GEN:  Well nourished, well developed in no acute distress HEENT: Normal NECK: No JVD; No carotid bruits LYMPHATICS: No lymphadenopathy CARDIAC: RRR, no murmurs, rubs, gallops RESPIRATORY:  Clear to auscultation without rales, wheezing or rhonchi  ABDOMEN: Soft, non-tender, non-distended MUSCULOSKELETAL:  No edema; No deformity  SKIN: Warm and dry NEUROLOGIC:  Alert and oriented x 3 PSYCHIATRIC:  Normal affect   ASSESSMENT:    1. Bradycardia   2. Abnormality of heart beat   3. Smoking    PLAN:    In order of problems listed above:  1.  Patient with history of sinus bradycardia at baseline.  Had an episode of dizziness in the setting of wearing a new mask.  Heart rate checked in the office noted to be 36 and irregular.  EKG in the ED and in the office today have since been sinus bradycardic.  She notes occasional extra heartbeats.  Will evaluate with a cardiac monitor x2 weeks for any arrhythmias or heart blocks that could be contributing.  I think her episode  of dizziness in the setting of new mask usage is unrelated to her baseline sinus bradycardia.  However, we will evaluate with cardiac monitor as mentioned above.  2.  Patient with history of smoking.  Smoking cessation advised.  Over 3 minutes spent.  Follow-up after cardiac monitoring 1 month.      This note was generated in part or whole with voice recognition software.  Voice recognition is usually quite accurate but there are transcription errors that can and very often do occur. I apologize for any typographical errors that were not detected and corrected.  Medication Adjustments/Labs and Tests Ordered: Current medicines are reviewed at length with the patient today.  Concerns regarding medicines are outlined above.  Orders Placed This Encounter  Procedures  . LONG TERM MONITOR (3-14 DAYS)  . EKG 12-Lead   No orders of the defined types were placed in this encounter.   Patient Instructions  Medication Instructions:  Your physician recommends that you continue on your current medications as directed. Please refer to the Current Medication list given to you today.  *If you need a refill on your cardiac medications before your next appointment, please call your pharmacy*  Lab Work: none If you have labs (blood work) drawn today and your tests are completely normal, you will receive your results only by: Marland Kitchen MyChart Message (if you have MyChart) OR . A paper copy in the mail If you have any lab test that is abnormal or we need to change your treatment, we will call you to review the results.  Testing/Procedures: ZIO Monitor - for 14 days - Start today 11/09/19, Remove 11/23/19. A Zio Patch Event Heart monitor will be applied to your chest today.  You will wear the patch for 14 days. After 24 hours, you may shower with the heart monitor on. If you feel any symptoms, you may press and release the button in the middle of the monitor.  Follow-Up: At [facility], you and your health  needs are our priority.  As part of our continuing mission to provide you with exceptional heart care, we have created designated Provider Care Teams.  These Care Teams include your primary Cardiologist (physician) and Advanced Practice Providers (APPs -  Physician Assistants and Nurse Practitioners) who all work together to provide you with the care you need, when you need it.  Your next appointment:   1 month(s)  The format for your next appointment:   In Person  Provider:   Kate Sable, MD    Signed, Kate Sable, MD  11/09/2019 12:39 PM    [place]

## 2019-11-09 NOTE — Patient Instructions (Signed)
Medication Instructions:  Your physician recommends that you continue on your current medications as directed. Please refer to the Current Medication list given to you today.  *If you need a refill on your cardiac medications before your next appointment, please call your pharmacy*  Lab Work: none If you have labs (blood work) drawn today and your tests are completely normal, you will receive your results only by: Marland Kitchen MyChart Message (if you have MyChart) OR . A paper copy in the mail If you have any lab test that is abnormal or we need to change your treatment, we will call you to review the results.  Testing/Procedures: ZIO Monitor - for 14 days - Start today 11/09/19, Remove 11/23/19. A Zio Patch Event Heart monitor will be applied to your chest today.  You will wear the patch for 14 days. After 24 hours, you may shower with the heart monitor on. If you feel any symptoms, you may press and release the button in the middle of the monitor.  Follow-Up: At [facility], you and your health needs are our priority.  As part of our continuing mission to provide you with exceptional heart care, we have created designated Provider Care Teams.  These Care Teams include your primary Cardiologist (physician) and Advanced Practice Providers (APPs -  Physician Assistants and Nurse Practitioners) who all work together to provide you with the care you need, when you need it.  Your next appointment:   1 month(s)  The format for your next appointment:   In Person  Provider:   Kate Sable, MD

## 2019-11-13 ENCOUNTER — Other Ambulatory Visit: Payer: 59

## 2019-12-07 ENCOUNTER — Ambulatory Visit (INDEPENDENT_AMBULATORY_CARE_PROVIDER_SITE_OTHER): Payer: 59 | Admitting: Cardiology

## 2019-12-07 ENCOUNTER — Encounter: Payer: Self-pay | Admitting: Cardiology

## 2019-12-07 ENCOUNTER — Other Ambulatory Visit: Payer: Self-pay

## 2019-12-07 VITALS — BP 120/84 | HR 56 | Ht 66.0 in | Wt 204.2 lb

## 2019-12-07 DIAGNOSIS — F17200 Nicotine dependence, unspecified, uncomplicated: Secondary | ICD-10-CM | POA: Diagnosis not present

## 2019-12-07 DIAGNOSIS — R009 Unspecified abnormalities of heart beat: Secondary | ICD-10-CM

## 2019-12-07 NOTE — Patient Instructions (Signed)
Medication Instructions:  °Your physician recommends that you continue on your current medications as directed. Please refer to the Current Medication list given to you today. ° °*If you need a refill on your cardiac medications before your next appointment, please call your pharmacy* ° ° °Lab Work: °None ordered  °If you have labs (blood work) drawn today and your tests are completely normal, you will receive your results only by: °• MyChart Message (if you have MyChart) OR °• A paper copy in the mail °If you have any lab test that is abnormal or we need to change your treatment, we will call you to review the results. ° ° °Testing/Procedures: °None ordered  ° ° °Follow-Up: °At [facility], you and your health needs are our priority.  As part of our continuing mission to provide you with exceptional heart care, we have created designated Provider Care Teams.  These Care Teams include your primary Cardiologist (physician) and Advanced Practice Providers (APPs -  Physician Assistants and Nurse Practitioners) who all work together to provide you with the care you need, when you need it. ° °We recommend signing up for the patient portal called "MyChart".  Sign up information is provided on this After Visit Summary.  MyChart is used to connect with patients for Virtual Visits (Telemedicine).  Patients are able to view lab/test results, encounter notes, upcoming appointments, etc.  Non-urgent messages can be sent to your provider as well.   °To learn more about what you can do with MyChart, go to [email or web address].   ° °Your next appointment:   °As needed °

## 2019-12-07 NOTE — Progress Notes (Signed)
Cardiology Office Note:    Date:  12/07/2019   ID:  Kristina Hall, DOB January 29, 1966, MRN [id number]  PCP:  Crecencio Mc, MD  Cardiologist:  No primary care provider on file.  Electrophysiologist:  None   Referring MD: Crecencio Mc, MD   Chief Complaint  Patient presents with  . office visit    F/U after wearing ZIO monitor     History of Present Illness:    Kristina Hall is a 54 y.o. female current smoker, who presents for follow-up.  She was last seen due to low heart rates.    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  While at work, she felt dizzy.  She attributed it to wearing a new mask due to the pandemic and was not breathing well.  Her blood pressure was checked in the office which was normal.  However her heart rate was noted to be 42.  A manual heart recheck revealed heart rate of 36 and pulse was felt to be irregular.  She was then advised to go to the emergency room.  EKG showed sinus bradycardia with heart rate 49, other labs and blood work including high-sensitivity troponin was normal.  She denied any chest pain, shortness of breath, palpitations, presyncope or syncope.  The episode of dizziness have since resolved.  She denies taking any AV nodal blocking agents.  She states having a low heart rate at baseline in the 50s.  Cardiac monitor was obtained to evaluate any arrhythmias.  She states her symptoms have slightly improved and her dizziness usually occur when she bends down and then raises her head too quickly or moves her head fast from left to right.  Past Medical History:  Diagnosis Date  . Nasal polyp     Past Surgical History:  Procedure Laterality Date  . supracervical hysterectomy     due to menorrhagia     Current Medications: Current Meds  Medication Sig  . ROCKLATAN 0.02-0.005 % SOLN Apply 1 drop to eye at bedtime.     Allergies:   Patient has no known allergies.   Social History   Socioeconomic  History  . Marital status: Married    Spouse name: Not on file  . Number of children: Not on file  . Years of education: Not on file  . Highest education level: Not on file  Occupational History  . Not on file  Tobacco Use  . Smoking status: Current Every Day Smoker    Packs/day: 0.25  . Smokeless tobacco: Never Used  . Tobacco comment: 6 ciggs a day  Substance and Sexual Activity  . Alcohol use: Yes    Alcohol/week: 1.0 standard drinks    Types: 1 Glasses of wine per week    Comment: occassionally  . Drug use: Never  . Sexual activity: Not on file  Other Topics Concern  . Not on file  Social History Narrative   2 childrens aged 57 and 75, both in [place] married   Social Determinants of Radio broadcast assistant Strain:   . Difficulty of Paying Living Expenses: Not on file  Food Insecurity:   . Worried About Charity fundraiser in the Last Year: Not on file  . Ran Out of Food in the Last Year: Not on file  Transportation Needs:   . Lack of Transportation (Medical): Not on file  . Lack of Transportation (Non-Medical): Not on file  Physical Activity:   .  Days of Exercise per Week: Not on file  . Minutes of Exercise per Session: Not on file  Stress:   . Feeling of Stress : Not on file  Social Connections:   . Frequency of Communication with Friends and Family: Not on file  . Frequency of Social Gatherings with Friends and Family: Not on file  . Attends Religious Services: Not on file  . Active Member of Clubs or Organizations: Not on file  . Attends Archivist Meetings: Not on file  . Marital Status: Not on file     Family History: The patient's family history includes Congestive Heart Failure in her paternal grandmother; Congestive Heart Failure (age of onset: 79) in her father; Coronary artery disease in her paternal uncle; Hypotension in her mother.  ROS:   Please see the history of present illness.     All other systems reviewed and are  negative.  EKGs/Labs/Other Studies Reviewed:    The following studies were reviewed today:  EKG:  EKG is  ordered today.  The ekg ordered today demonstrates bradycardia, heart rate 56  Recent Labs: 10/25/2019: BUN 18; Creatinine, Ser 0.73; Potassium 3.7; Sodium 141 11/08/2019: Hemoglobin 15.1; Magnesium 2.3; Platelets 205.0; TSH 2.09  Recent Lipid Panel    Component Value Date/Time   CHOL 186 11/08/2019 0849   CHOL 184 01/08/2017 0000   TRIG 35.0 11/08/2019 0849   HDL 69.00 11/08/2019 0849   HDL 72 01/08/2017 0000   CHOLHDL 3 11/08/2019 0849   VLDL 7.0 11/08/2019 0849   LDLCALC 110 (H) 11/08/2019 0849   LDLCALC 102 (H) 01/08/2017 0000    Physical Exam:    VS:  BP 120/84 (BP Location: Left Arm, Patient Position: Sitting, Cuff Size: Normal)   Pulse (!) 56   Ht 5\' 6"  (1.676 m)   Wt 204 lb 4 oz (92.6 kg)   SpO2 98%   BMI 32.97 kg/m     Wt Readings from Last 3 Encounters:  12/07/19 204 lb 4 oz (92.6 kg)  11/09/19 201 lb 4 oz (91.3 kg)  11/07/19 199 lb (90.3 kg)     GEN:  Well nourished, well developed in no acute distress HEENT: Normal NECK: No JVD; No carotid bruits LYMPHATICS: No lymphadenopathy CARDIAC: RRR, no murmurs, rubs, gallops RESPIRATORY:  Clear to auscultation without rales, wheezing or rhonchi  ABDOMEN: Soft, non-tender, non-distended MUSCULOSKELETAL:  No edema; No deformity  SKIN: Warm and dry NEUROLOGIC:  Alert and oriented x 3 PSYCHIATRIC:  Normal affect   ASSESSMENT:    1. Abnormality of heart beat   2. Smoking    PLAN:    In order of problems listed above:  1.  Patient with history of sinus bradycardia at baseline.  Had an episode of dizziness in the setting of wearing a new mask.  Heart rate checked in the office noted to be 36 and irregular.  EKG in the ED and in the office today have since been sinus bradycardic.  She notes occasional extra heartbeats.  Cardiac monitor x2 weeks showed occasional SVTs, patient triggered events were associated  with ventricular ectopy.  Overall benign cardiac monitor.  Patient reassured.  Symptoms of dizziness are consistent with vertigo.  She was counseled on rising up slowly from a seated position and avoiding abrupt head movements.  She can follow-up with her primary care provider for further management.  2.  Patient with history of smoking.  Smoking cessation advised.     Follow-up as needed    This  note was generated in part or whole with voice recognition software. Voice recognition is usually quite accurate but there are transcription errors that can and very often do occur. I apologize for any typographical errors that were not detected and corrected.  Medication Adjustments/Labs and Tests Ordered: Current medicines are reviewed at length with the patient today.  Concerns regarding medicines are outlined above.  Orders Placed This Encounter  Procedures  . EKG 12-Lead   No orders of the defined types were placed in this encounter.   Patient Instructions  Medication Instructions:  Your physician recommends that you continue on your current medications as directed. Please refer to the Current Medication list given to you today.  *If you need a refill on your cardiac medications before your next appointment, please call your pharmacy*   Lab Work: None ordered  If you have labs (blood work) drawn today and your tests are completely normal, you will receive your results only by: Marland Kitchen MyChart Message (if you have MyChart) OR . A paper copy in the mail If you have any lab test that is abnormal or we need to change your treatment, we will call you to review the results.   Testing/Procedures: None ordered    Follow-Up: At [facility], you and your health needs are our priority.  As part of our continuing mission to provide you with exceptional heart care, we have created designated Provider Care Teams.  These Care Teams include your primary Cardiologist (physician) and Advanced Practice  Providers (APPs -  Physician Assistants and Nurse Practitioners) who all work together to provide you with the care you need, when you need it.  We recommend signing up for the patient portal called "MyChart".  Sign up information is provided on this After Visit Summary.  MyChart is used to connect with patients for Virtual Visits (Telemedicine).  Patients are able to view lab/test results, encounter notes, upcoming appointments, etc.  Non-urgent messages can be sent to your provider as well.   To learn more about what you can do with MyChart, go to [email or web address].    Your next appointment:  As needed.    Signed, Kate Sable, MD  12/07/2019 12:13 PM    [place]

## 2020-02-02 ENCOUNTER — Encounter: Payer: Self-pay | Admitting: Internal Medicine

## 2020-02-02 ENCOUNTER — Ambulatory Visit: Payer: 59 | Admitting: Internal Medicine

## 2020-02-02 DIAGNOSIS — H1012 Acute atopic conjunctivitis, left eye: Secondary | ICD-10-CM | POA: Diagnosis not present

## 2020-02-02 DIAGNOSIS — J309 Allergic rhinitis, unspecified: Secondary | ICD-10-CM | POA: Diagnosis not present

## 2020-02-02 MED ORDER — OLOPATADINE HCL 0.1 % OP SOLN: 1.0000 [drp] | Freq: Two times a day (BID) | OPHTHALMIC | 12 refills | Status: DC

## 2020-02-04 NOTE — Progress Notes (Signed)
Subjective:  Patient ID: Kristina Hall, female    DOB: 10/08/1965  Age: 54 y.o. MRN: [id number]  CC: The encounter diagnosis was Allergic conjunctivitis and rhinitis, left.  HPI Kristina Hall presents for evaluation and treatment of left eye itching and draining.  Symptoms started 36 to 48 hours ago with spontaneous draining of clear fluid without itching or redness.  Woke up this morning with continued drainage accompanied by itching and irritation.  No recent viral illness.  Right eye without symptoms.   This visit occurred during the SARS-CoV-2 public health emergency.  Safety protocols were in place, including screening questions prior to the visit, additional usage of staff PPE, and extensive cleaning of exam room while observing appropriate contact time as indicated for disinfecting solutions.     Outpatient Medications Prior to Visit  Medication Sig Dispense Refill  . ROCKLATAN 0.02-0.005 % SOLN Apply 1 drop to eye at bedtime.     No facility-administered medications prior to visit.    Review of Systems;  Patient denies headache, fevers, malaise, unintentional weight loss, skin rash, eye pain, sinus congestion and sinus pain, sore throat, dysphagia,  hemoptysis , cough, dyspnea, wheezing, chest pain, palpitations, orthopnea, edema, abdominal pain, nausea, melena, diarrhea, constipation, flank pain, dysuria, hematuria, urinary  Frequency, nocturia, numbness, tingling, seizures,  Focal weakness, Loss of consciousness,  Tremor, insomnia, depression, anxiety, and suicidal ideation.      Objective:  Ht 5\' 6"  (1.676 m)   Wt 204 lb (92.5 kg)   BMI 32.93 kg/m   BP Readings from Last 3 Encounters:  12/07/19 120/84  11/09/19 128/70  11/07/19 122/70    Wt Readings from Last 3 Encounters:  02/02/20 204 lb (92.5 kg)  12/07/19 204 lb 4 oz (92.6 kg)  11/09/19 201 lb 4 oz (91.3 kg)    General appearance: alert, cooperative and appears stated age Ears: normal  TM's and external ear canals both ears Eyes: left sclera slightly injected,  No purulent discharge.  Right eye normal  Throat: lips, mucosa, and tongue normal; teeth and gums normal Neck: no adenopathy, no carotid bruit, supple, symmetrical, trachea midline and thyroid not enlarged, symmetric, no tenderness/mass/nodules Back: symmetric, no curvature. ROM normal. No CVA tenderness. Lungs: clear to auscultation bilaterally Heart: regular rate and rhythm, S1, S2 normal, no murmur, click, rub or gallop Skin: Skin color, texture, turgor normal. No rashes or lesions Lymph nodes: Cervical, supraclavicular, and axillary nodes normal.  No results found for: HGBA1C  Lab Results  Component Value Date   CREATININE 0.73 10/25/2019   CREATININE 0.84 01/08/2017   CREATININE 0.75 07/08/2014    Lab Results  Component Value Date   WBC 6.4 11/08/2019   HGB 15.1 (H) 11/08/2019   HCT 45.4 11/08/2019   PLT 205.0 11/08/2019   GLUCOSE 92 10/25/2019   CHOL 186 11/08/2019   TRIG 35.0 11/08/2019   HDL 69.00 11/08/2019   LDLCALC 110 (H) 11/08/2019   ALT 9 01/08/2017   AST 16 01/08/2017   NA 141 10/25/2019   K 3.7 10/25/2019   CL 106 10/25/2019   CREATININE 0.73 10/25/2019   BUN 18 10/25/2019   CO2 26 10/25/2019   TSH 2.09 11/08/2019    DG Finger Thumb Right  Result Date: 02/04/2015 CLINICAL DATA:  Pain.  No reported injury.  Initial evaluation. EXAM: RIGHT THUMB 2+V COMPARISON:  None. FINDINGS: No acute bony or joint abnormality identified. No evidence of fracture or dislocation. IMPRESSION: No acute abnormality. Electronically Signed   By:  Lena   On: 02/04/2015 16:12    Assessment & Plan:   Problem List Items Addressed This Visit      Unprioritized   Allergic conjunctivitis and rhinitis, left    No signs of bacterial conjunctivitis.  Adding antihistamine eye drops to oral antihistamine         I am having Kristina Minassian. Hall start on olopatadine. I am also having her maintain  her Rocklatan.  Meds ordered this encounter  Medications  . olopatadine (PATANOL) 0.1 % ophthalmic solution    Sig: Place 1 drop into the left eye 2 (two) times daily.    Dispense:  5 mL    Refill:  12    There are no discontinued medications.  Follow-up: No follow-ups on file.   Crecencio Mc, MD

## 2020-02-04 NOTE — Assessment & Plan Note (Addendum)
No signs of bacterial conjunctivitis.  Adding antihistamine eye drops to oral antihistamine

## 2021-01-21 ENCOUNTER — Encounter: Payer: Self-pay | Admitting: Internal Medicine

## 2021-01-21 ENCOUNTER — Telehealth: Payer: Self-pay

## 2021-01-21 ENCOUNTER — Ambulatory Visit: Payer: 59 | Admitting: Internal Medicine

## 2021-01-21 ENCOUNTER — Other Ambulatory Visit: Payer: Self-pay | Admitting: Internal Medicine

## 2021-01-21 ENCOUNTER — Other Ambulatory Visit: Payer: Self-pay

## 2021-01-21 ENCOUNTER — Ambulatory Visit (INDEPENDENT_AMBULATORY_CARE_PROVIDER_SITE_OTHER): Payer: 59

## 2021-01-21 DIAGNOSIS — S99929A Unspecified injury of unspecified foot, initial encounter: Secondary | ICD-10-CM

## 2021-01-21 DIAGNOSIS — S299XXA Unspecified injury of thorax, initial encounter: Secondary | ICD-10-CM | POA: Insufficient documentation

## 2021-01-21 DIAGNOSIS — S2090XA Unspecified superficial injury of unspecified parts of thorax, initial encounter: Secondary | ICD-10-CM | POA: Diagnosis not present

## 2021-01-21 DIAGNOSIS — S99921A Unspecified injury of right foot, initial encounter: Secondary | ICD-10-CM

## 2021-01-21 MED ORDER — TRAMADOL HCL 50 MG PO TABS: 50.0000 mg | ORAL_TABLET | Freq: Four times a day (QID) | ORAL | 0 refills | Status: AC | PRN

## 2021-01-21 NOTE — Progress Notes (Signed)
Subjective:  Patient ID: Kristina Hall, female    DOB: Feb 16, 1966  Age: 55 y.o. MRN: [id number]  CC: Diagnoses of Acute traumatic injury of chest wall and Injury of right great toe, initial encounter were pertinent to this visit.  HPI Kristina Hall presents for urgent evaluation of chest wall and toe pain following a fall  This visit occurred during the SARS-CoV-2 public health emergency.  Safety protocols were in place, including screening questions prior to the visit, additional usage of staff PPE, and extensive cleaning of exam room while observing appropriate contact time as indicated for disinfecting solutions.   Patient had a fall yesterday evening while descending the stairs to her garage.  Since the fall she has had equsitite chest wall pain on the right anterior and posterior walls,  Aggravated by deep breathing and movement of right arm.   Also has a bruised swollen right great toe since the fall    Had a very hard "bearhug" type squeeze by a female fellow employee several days prior and felt a pop and immediate pain along her right scapula.  Was "sore " and tender to palpation in that area over the weekend but was able to perform all ADLS and work in the yard.  Outpatient Medications Prior to Visit  Medication Sig Dispense Refill  . VYZULTA 0.024 % SOLN Apply 1 drop to eye at bedtime.    Marland Kitchen olopatadine (PATANOL) 0.1 % ophthalmic solution Place 1 drop into the left eye 2 (two) times daily. (Patient not taking: Reported on 01/21/2021) 5 mL 12  . ROCKLATAN 0.02-0.005 % SOLN Apply 1 drop to eye at bedtime. (Patient not taking: Reported on 01/21/2021)     No facility-administered medications prior to visit.    Review of Systems;  Patient denies headache, fevers, malaise, unintentional weight loss, skin rash, eye pain, sinus congestion and sinus pain, sore throat, dysphagia,  hemoptysis , cough, dyspnea, wheezing, chest pain, palpitations, orthopnea, edema, abdominal  pain, nausea, melena, diarrhea, constipation, flank pain, dysuria, hematuria, urinary  Frequency, nocturia, numbness, tingling, seizures,  Focal weakness, Loss of consciousness,  Tremor, insomnia, depression, anxiety, and suicidal ideation.      Objective:  BP 112/76 (BP Location: Left Arm, Patient Position: Sitting, Cuff Size: Normal)   Pulse (!) 54   Temp 98.1 F (36.7 C) (Oral)   Resp 14   Ht 5\' 6"  (1.676 m)   Wt 206 lb (93.4 kg)   SpO2 99%   BMI 33.25 kg/m   BP Readings from Last 3 Encounters:  01/21/21 112/76  12/07/19 120/84  11/09/19 128/70    Wt Readings from Last 3 Encounters:  01/21/21 206 lb (93.4 kg)  02/02/20 204 lb (92.5 kg)  12/07/19 204 lb 4 oz (92.6 kg)    General appearance: alert, cooperative and appears stated age Ears: normal TM's and external ear canals both ears Throat: lips, mucosa, and tongue normal; teeth and gums normal Neck: no adenopathy, no carotid bruit, supple, symmetrical, trachea midline and thyroid not enlarged, symmetric, no tenderness/mass/nodules Back: symmetric, with mild swelling of right posterior thoracic with tenderness to palpation over scapular,  Without bruising  Lungs: clear to auscultation bilaterally Chest wall:  Tender to palpation anteriorly without bruising Heart: regular rate and rhythm, S1, S2 normal, no murmur, click, rub or gallop Abdomen: soft, non-tender; bowel sounds normal; no masses,  no organomegaly Pulses: 2+ and symmetric Skin: Skin color, texture, turgor normal. No rashes or lesions Lymph nodes: Cervical, supraclavicular, and axillary nodes  normal. Ext:  Right great toe with medial swelling and bruising at the MTP joint   No results found for: HGBA1C  Lab Results  Component Value Date   CREATININE 0.73 10/25/2019   CREATININE 0.84 01/08/2017   CREATININE 0.75 07/08/2014    Lab Results  Component Value Date   WBC 6.4 11/08/2019   HGB 15.1 (H) 11/08/2019   HCT 45.4 11/08/2019   PLT 205.0 11/08/2019    GLUCOSE 92 10/25/2019   CHOL 186 11/08/2019   TRIG 35.0 11/08/2019   HDL 69.00 11/08/2019   LDLCALC 110 (H) 11/08/2019   ALT 9 01/08/2017   AST 16 01/08/2017   NA 141 10/25/2019   K 3.7 10/25/2019   CL 106 10/25/2019   CREATININE 0.73 10/25/2019   BUN 18 10/25/2019   CO2 26 10/25/2019   TSH 2.09 11/08/2019    DG Finger Thumb Right  Result Date: 02/04/2015 CLINICAL DATA:  Pain.  No reported injury.  Initial evaluation. EXAM: RIGHT THUMB 2+V COMPARISON:  None. FINDINGS: No acute bony or joint abnormality identified. No evidence of fracture or dislocation. IMPRESSION: No acute abnormality. Electronically Signed   By: Marcello Moores  Register   On: 02/04/2015 16:12    Assessment & Plan:   Problem List Items Addressed This Visit      Unprioritized   Injury of right great toe    Plain films to rule  out fracture are negative per my read but awaiting radiology read.       Acute traumatic injury of chest wall    Plain films negative for displaced rib fractures but awaiting read from radiologist.  Pain is not controlled with 800 g ibuprofen and 1000 mg tylenol  Adding tramadol 50 mg every 6 hour,  Ice, heat as tolerated          I have discontinued Lydia Guiles. Hall's Rocklatan and olopatadine. I am also having her start on traMADol. Additionally, I am having her maintain her Vyzulta.  Meds ordered this encounter  Medications  . traMADol (ULTRAM) 50 MG tablet    Sig: Take 1 tablet (50 mg total) by mouth every 6 (six) hours as needed for up to 7 days.    Dispense:  28 tablet    Refill:  0    Medications Discontinued During This Encounter  Medication Reason  . olopatadine (PATANOL) 0.1 % ophthalmic solution   . ROCKLATAN 0.02-0.005 % SOLN     Follow-up: No follow-ups on file.   Crecencio Mc, MD

## 2021-01-21 NOTE — Addendum Note (Signed)
Addended by: Crecencio Mc on: 01/21/2021 09:51 AM   Modules accepted: Orders

## 2021-01-21 NOTE — Assessment & Plan Note (Signed)
Plain films negative for displaced rib fractures but awaiting read from radiologist.  Pain is not controlled with 800 g ibuprofen and 1000 mg tylenol  Adding tramadol 50 mg every 6 hour,  Ice, heat as tolerated

## 2021-01-21 NOTE — Telephone Encounter (Signed)
Spoke with pt today and she stated that she fell last night and is in a lot of pain on the right side of her chest and the right shoulder blade area. She has limited range of motion with her arm and when she raises it above her head she feels popping in her ribs on the right side. Pt stated that it hurts to take a deep breathe, bend over, sneeze. She also thinks that she has broken her right great toe because it is black and blue. She stated that she has some pain in her left thigh as well. Pt is taking 800 mg ibuprofen and 2 extra strength tylenol, that is not touching the pain. Pt was wondering if she could be worked in today?

## 2021-01-21 NOTE — Assessment & Plan Note (Signed)
Plain films to rule  out fracture are negative per my read but awaiting radiology read.

## 2021-01-21 NOTE — Telephone Encounter (Signed)
Yes.  Add her on as 12:30.  If she has time to get the rib and toe films before her visit,  I have ordered, them to be done at the office

## 2021-01-21 NOTE — Telephone Encounter (Signed)
Pt has been added to scheduled and aware that xrays has been added.

## 2021-01-22 ENCOUNTER — Other Ambulatory Visit: Payer: Self-pay | Admitting: Internal Medicine

## 2021-01-22 ENCOUNTER — Encounter: Payer: Self-pay | Admitting: Podiatry

## 2021-01-22 ENCOUNTER — Ambulatory Visit: Payer: 59 | Admitting: Podiatry

## 2021-01-22 DIAGNOSIS — S92424A Nondisplaced fracture of distal phalanx of right great toe, initial encounter for closed fracture: Secondary | ICD-10-CM

## 2021-01-22 NOTE — Patient Instructions (Addendum)
Wear the shoe for at least 2 weeks, you may need to wear it for 4 weeks total. Then try wearing a supportive shoe or sneaker.   Ice is better than heat for these injuries  You can take 600mg  ibuprofen in addition to the tylenol and tramadol   Toe Fracture A toe fracture is a break in one of the toe bones (phalanges). A toe fracture may be:  A crack in the surface of the bone (stress fracture). This often occurs in athletes.  A break all the way through the bone (complete fracture). What are the causes? This condition may be caused by:  Direct impact, such as from dropping a heavy object on your toe.  Stubbing your toe.  Twisting or stretching your toe out of place.  Overuse or repetitive exercise. What increases the risk? You are more likely to develop this condition if you:  Play contact sports.  Have a condition that causes the bones to become thin and brittle (osteoporosis).  Have a low calcium level. What are the signs or symptoms? The main symptoms of this condition are swelling and pain in the toe. Other symptoms include:  Bruising.  Stiffness.  Numbness.  A change in the way the toe looks.  Broken bones that poke through the skin.  Blood beneath the toenail.   How is this diagnosed? This condition is diagnosed with a physical exam. You may also have X-rays. How is this treated? Treatment for this condition depends on the type of fracture and its severity. Treatment may include:  Taping the broken toe to a toe that is next to it (buddy taping). This is the most common treatment for fractures in which the bone has not moved out of place (non-displaced fracture).  Wearing a shoe that has a wide, rigid sole to protect the toe and to limit its movement.  Wearing a walking cast.  Having a procedure to move the toe back into place.  Surgery. This may be needed if the: ? Pieces of broken bone are out of place (displaced). ? Bone breaks through the  skin.  Physical therapy. This is done to help regain movement and strength in the toe. You may need follow-up X-rays to make sure that the bone is healing well and staying in position. Follow these instructions at home: If you have a shoe:  Wear the shoe as told by your health care provider. Remove it only as told by your health care provider.  Loosen the shoe if your toes tingle, become numb, or turn cold and blue.  Keep the shoe clean and dry. If you have a cast:  Do not put pressure on any part of the cast until it is fully hardened. This may take several hours.  Do not stick anything inside the cast to scratch your skin. Doing that increases your risk of infection.  Check the skin around the cast every day. Tell your health care provider about any concerns.  You may put lotion on dry skin around the edges of the cast. Do not put lotion on the skin underneath the cast.  Keep the cast clean and dry. Bathing  Do not take baths, swim, or use a hot tub until your health care provider approves. Ask your health care provider if you can take showers.  If the shoe or cast is not waterproof: ? Do not let it get wet. ? Cover it with a watertight covering when you take a bath or a shower. Activity  Do not use the injured foot to support your body weight until your health care provider says that you can. Use crutches as directed.  Ask your health care provider: ? What activities are safe for you during recovery. ? What activities you need to avoid.  Do physical therapy exercises as directed. Driving  Do not drive or use heavy machinery while taking pain medicine.  Do not drive while wearing a cast on a foot that you use for driving. Managing pain, stiffness, and swelling  If directed, put ice on painful areas: ? Put ice in a plastic bag. ? Place a towel between your skin and the bag.  If you have a shoe, remove it as told by your health care provider.  If you have a cast,  place a towel between your cast and the bag. ? Leave the ice on for 20 minutes, 2-3 times per day.  Raise (elevate) the injured area above the level of your heart while you are sitting or lying down.   General instructions  If your toe was treated with buddy taping, follow your health care provider's instructions for changing the gauze and tape. Change it more often if: ? The gauze and tape get wet. If this happens, dry the space between the toes. ? The gauze and tape are too tight and cause your toe to become pale or numb.  If you were not given a protective shoe, wear sturdy, supportive shoes. Your shoes should not pinch your toes and should not fit tightly against your toes.  Do not use any products that contain nicotine or tobacco, such as cigarettes and e-cigarettes. These can delay bone healing. If you need help quitting, ask your health care provider.  Take over-the-counter and prescription medicines only as told by your health care provider.  Keep all follow-up visits as told by your health care provider. This is important. Contact a health care provider if you have:  Pain that gets worse or does not get better with medicine.  A fever.  A bad smell coming from your cast. Get help right away if you have:  Any of the following in your toes or your foot: ? Numbness that gets worse. ? Tingling. ? Coldness. ? Blue skin.  Redness or swelling that gets worse.  Pain that suddenly becomes severe. Summary  A toe fracture is a break in one of the toe bones (phalanges).  Treatment depends on how severe your fracture is and how the pieces of the broken bone line up with each other. Treatment may include buddy taping, wearing a shoe or a cast, or using crutches.  Ice and elevate your foot to help lessen the pain and swelling. This information is not intended to replace advice given to you by your health care provider. Make sure you discuss any questions you have with your health  care provider. Document Revised: 01/04/2018 Document Reviewed: 11/02/2017 Elsevier Patient Education  2021 [employer].

## 2021-01-26 NOTE — Progress Notes (Signed)
  Subjective:  Patient ID: Kristina Hall, female    DOB: 29-Jul-1966,  MRN: [id number]  Chief Complaint  Patient presents with  . Toe Injury    Patient presents today referral by Dr. Derrel Nip for right hallux injury.  She says she fell on Monday and somehow hurt her first 3 toes on right foot.  She says the bottom of her hallux is numb and tingly and toes 2,3 are sore and swollen    55 y.o. female presents with the above complaint. History confirmed with patient.   Objective:  Physical Exam: warm, good capillary refill, no trophic changes or ulcerative lesions, normal DP and PT pulses and normal sensory exam.   Right Foot: Hallux she has pain on palpation of the IPJ of range of motion  No images are attached to the encounter.  Radiographs: X-ray reviewed from PCP office: Nondisplaced fracture of the lateral base of the distal phalanx Assessment:   1. Closed nondisplaced fracture of distal phalanx of right great toe, initial encounter      Plan:  Patient was evaluated and treated and all questions answered.  Right hallux fracture -XR Reviewed with patient -Would benefit from trial of non-operative management for this injury. -WBAT in surgical shoe this was dispensed -Discussed with her long-term if this did not heal or became problematic excision of the fracture fragment or IPJ fusion would be treatment for this  Return if symptoms worsen or fail to improve.

## 2021-04-22 IMAGING — CR DG CHEST 2V
2 series · 2 of 2 positions shown · non-contrast
Comparison: None.

CLINICAL DATA: Chest pain

EXAM:
CHEST - 2 VIEW

[chest pa]
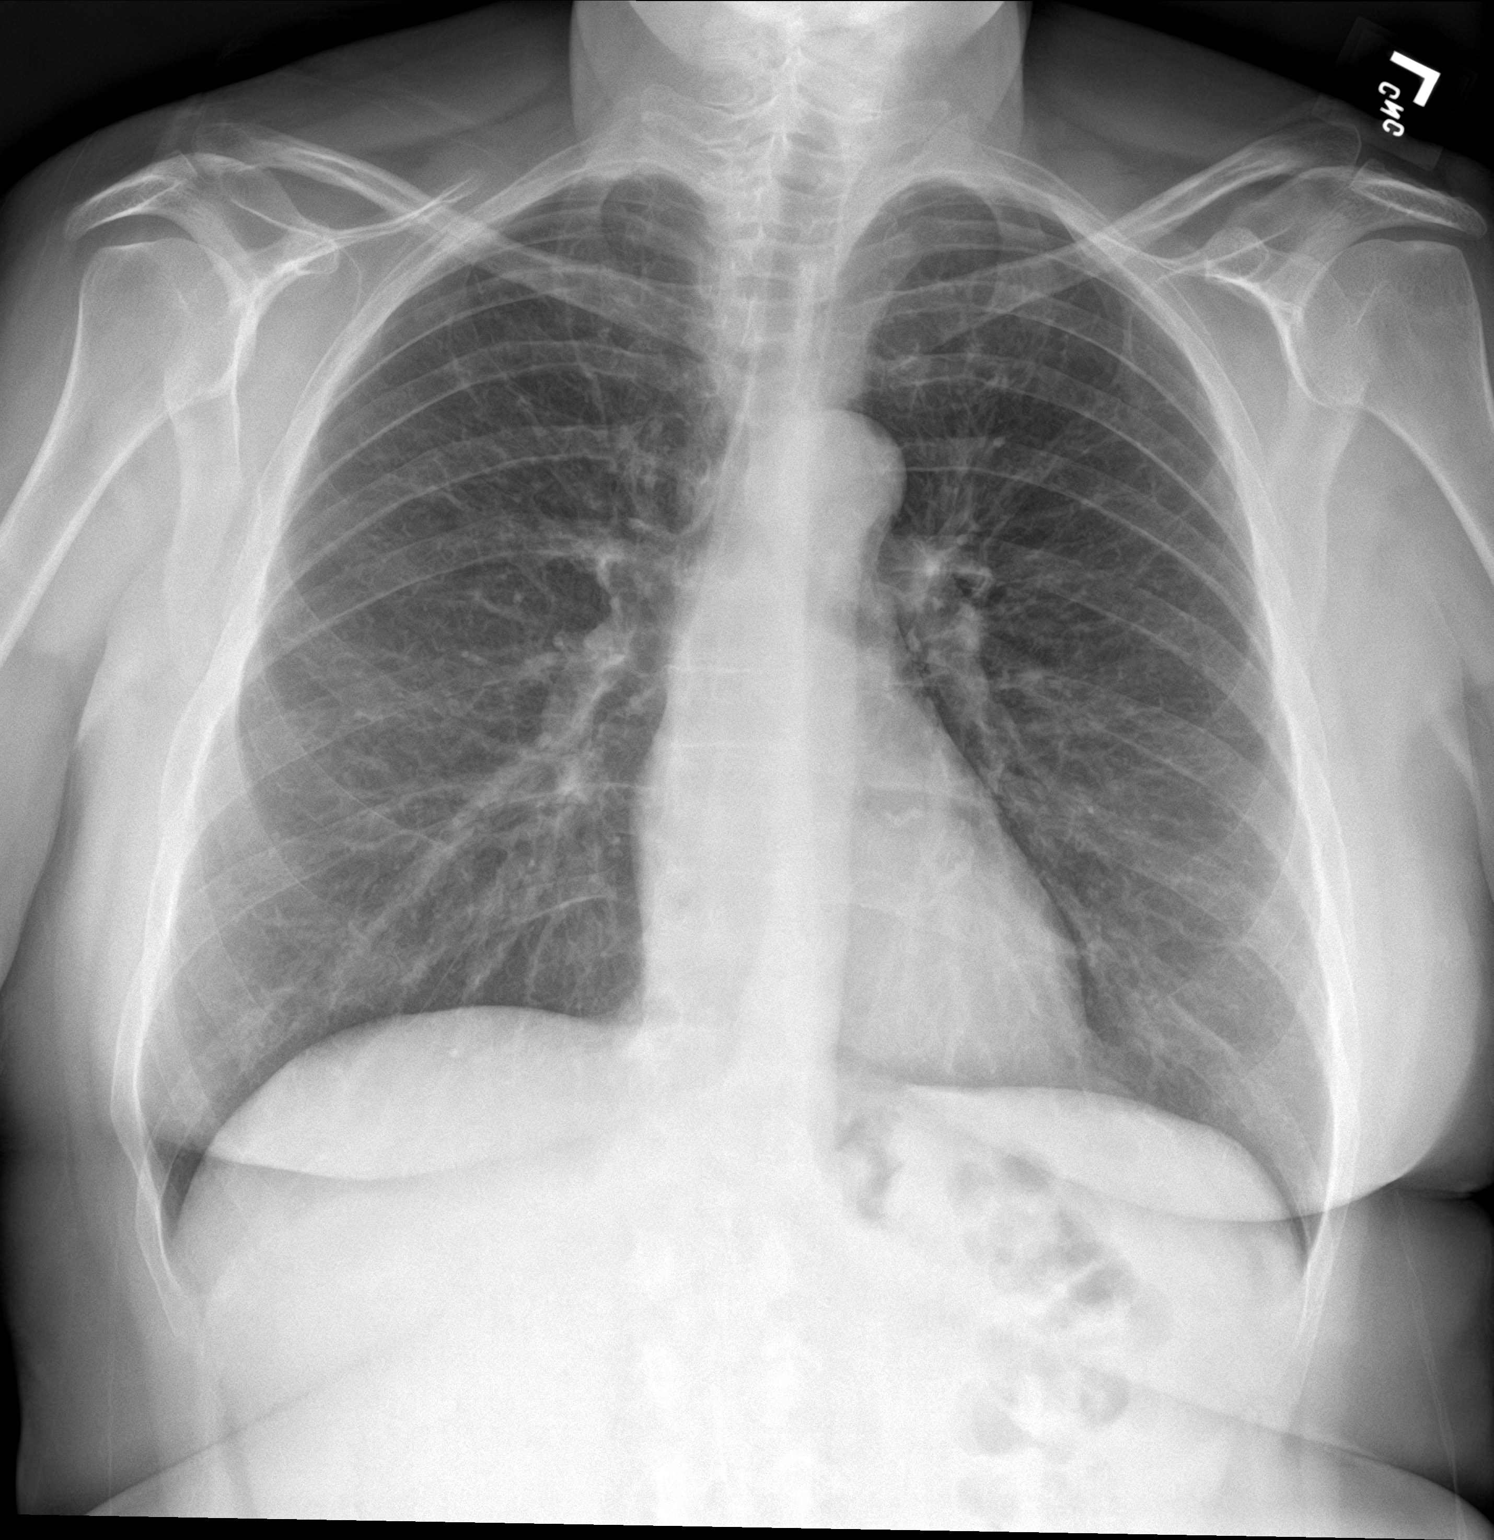

[chest lat]
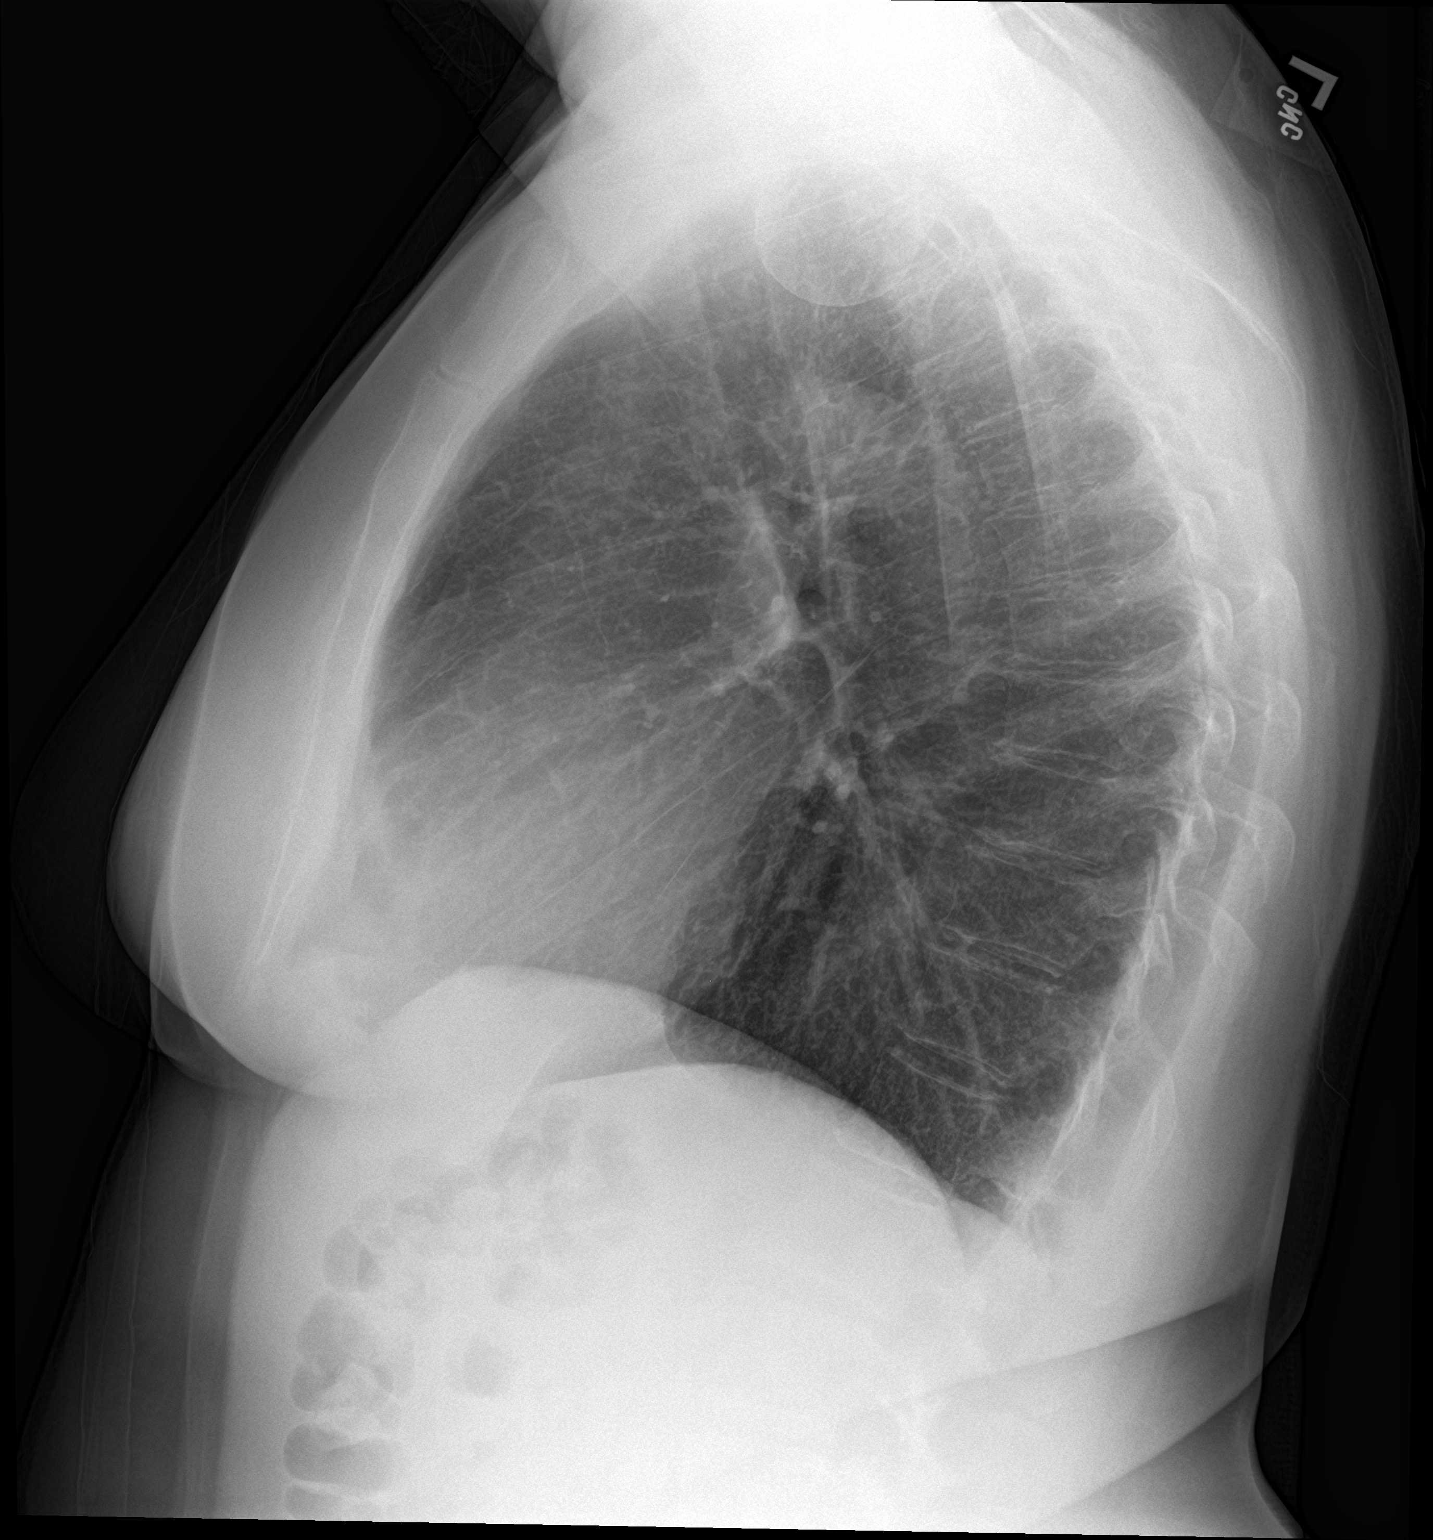

[2 of 2 positions shown; findings below may reference images not displayed]

FINDINGS: The heart size and mediastinal contours are within normal limits.
Both lungs are clear. The visualized skeletal structures are
unremarkable.
IMPRESSION: No active cardiopulmonary disease.

## 2021-07-21 ENCOUNTER — Ambulatory Visit (INDEPENDENT_AMBULATORY_CARE_PROVIDER_SITE_OTHER): Payer: 59

## 2021-07-21 ENCOUNTER — Ambulatory Visit: Payer: 59 | Admitting: Podiatry

## 2021-07-21 ENCOUNTER — Encounter: Payer: Self-pay | Admitting: Podiatry

## 2021-07-21 ENCOUNTER — Other Ambulatory Visit: Payer: Self-pay

## 2021-07-21 DIAGNOSIS — L720 Epidermal cyst: Secondary | ICD-10-CM

## 2021-07-21 DIAGNOSIS — L603 Nail dystrophy: Secondary | ICD-10-CM | POA: Diagnosis not present

## 2021-07-21 DIAGNOSIS — S91111A Laceration without foreign body of right great toe without damage to nail, initial encounter: Secondary | ICD-10-CM

## 2021-07-21 MED ORDER — MUPIROCIN 2 % EX OINT: 1.0000 "application " | TOPICAL_OINTMENT | Freq: Every day | CUTANEOUS | 2 refills | Status: DC

## 2021-07-22 NOTE — Progress Notes (Signed)
  Subjective:  Patient ID: Kristina Hall, female    DOB: 01/21/1966,  MRN: [id number]  Chief Complaint  Patient presents with   Toe Pain    "It's my toe.  I think there's something in my toe."      55 y.o. female presents with the above complaint. History confirmed with patient.  She thought it was a plantars wart and put salicylic acid on it.  It seemed to have a black seed to root in the middle of it.  This happened after lacerating the toe on her steps at her house.  The second toenail on both toes is also thickened and she wonders if there is nail fungus that is causing  Objective:  Physical Exam: warm, good capillary refill, no trophic changes or ulcerative lesions, normal DP and PT pulses and normal sensory exam.   Right Foot: Plantar hallux she has a hyperkeratotic lesion that is painful to touch with a central white core, dystrophy of the second toenail    Radiographs: X-ray taken today of the right foot showed no acute osseous abnormalities or foreign bodies Assessment:   1. Nail dystrophy   2. Laceration of great toe of right foot, foreign body presence unspecified, nail damage status unspecified, initial encounter      Plan:  Patient was evaluated and treated and all questions answered.   -I discussed with her the lesion appears to be consistent with a plantar verruca or benign-appearing skin lesion.  I recommended debridement of this it was painful so I did require a digital block to complete this.  After debridement of the lesion the central core was friable and easily removed, it appeared to be self-contained and I think this likely was an epidermal inclusion cyst that was giving her trouble it was excised completely and subcutaneous tissue remained that we allow to heal secondarily with mupirocin ointment and bandaging which I prescribed for her.  She tolerated the procedure itself well.  The lesion itself measured approximately 0.5 cm in diameter and was  spherical  Regarding the dystrophic nail I think this likely is dystrophy versus onychomycosis.  I took a biopsy of the nail and will send for culture and analysis to see if it requires treatment with antifungals.   No follow-ups on file.

## 2021-08-18 ENCOUNTER — Other Ambulatory Visit: Payer: Self-pay | Admitting: Internal Medicine

## 2021-08-18 DIAGNOSIS — J0100 Acute maxillary sinusitis, unspecified: Secondary | ICD-10-CM

## 2021-08-18 MED ORDER — PREDNISONE 10 MG PO TABS: ORAL_TABLET | ORAL | 0 refills | Status: DC

## 2021-08-18 MED ORDER — AMOXICILLIN-POT CLAVULANATE 875-125 MG PO TABS: 1.0000 | ORAL_TABLET | Freq: Two times a day (BID) | ORAL | 0 refills | Status: DC

## 2021-09-01 ENCOUNTER — Other Ambulatory Visit: Payer: Self-pay

## 2021-09-01 ENCOUNTER — Ambulatory Visit (INDEPENDENT_AMBULATORY_CARE_PROVIDER_SITE_OTHER): Payer: 59 | Admitting: Podiatry

## 2021-09-01 DIAGNOSIS — D2371 Other benign neoplasm of skin of right lower limb, including hip: Secondary | ICD-10-CM | POA: Diagnosis not present

## 2021-09-01 MED ORDER — CEPHALEXIN 500 MG PO CAPS: 500.0000 mg | ORAL_CAPSULE | Freq: Three times a day (TID) | ORAL | 0 refills | Status: AC

## 2021-09-01 NOTE — Progress Notes (Signed)
  Subjective:  Patient ID: Kristina Hall, female    DOB: 12/14/1965,  MRN: [id number]  Chief Complaint  Patient presents with   Foot Pain    "Same thing as before, it grew back."    55 y.o. female presents with the above complaint. History confirmed with patient.  Lesion has returned and is equally painful.  Objective:  Physical Exam: warm, good capillary refill, no trophic changes or ulcerative lesions, normal DP and PT pulses and normal sensory exam.   Right Foot: Plantar hallux she has a hyperkeratotic lesion that is painful to touch with a central white core,     Radiographs: X-ray taken today of the right foot showed no acute osseous abnormalities or foreign bodies Assessment:   1. Benign neoplasm of skin of right lower extremity      Plan:  Patient was evaluated and treated and all questions answered.   Unfortunately had recurrence of the mass.  At the last visit when I excised that it seemed consistent with an inclusion cyst.  I do not think removing the cyst again in the office would be beneficial in rather requires excision of the cyst with the overlying capsule.  I recommend we proceed with this in our surgical suite our office in [place].  We discussed the risk benefits and potential complications of this as well as the need to be off the foot and wear the postsurgical boot.  She would not build to drive while she is in the boot.  Dressing was stay clean dry and intact for the first week and then sutures will be removed at week 3.  She understands and wishes to proceed.  Informed consent was signed and reviewed.  Surgery be scheduled for next Thursday.  I prescribed her Keflex for 5 days she will take the first dose at 6 AM prior to the procedure as prophylaxis   No follow-ups on file.

## 2021-09-08 ENCOUNTER — Telehealth: Payer: Self-pay | Admitting: Urology

## 2021-09-08 NOTE — Telephone Encounter (Signed)
DOS - 09/11/21  34196 --- EXCISION SOFT TISSUE MASS RIGHT HALLUX   [facility]/ [employer] EFFECTIVE DATE - 10/05/10   SPOKE WITH SHATARRA P. WITH [facility] AND SHE STATED THAT CPT CODE IS NOT ON HER LIST, THEY DON'T HANDLE THIS CODE. SHE INFORMED ME THAT I NEED TO CALL [employer] (# [phone]). SO I CALLED [employer] AND SPOKE WITH MELODY AND SHE STATED THAT SHE DOESN'T HANDLE THIS PTS ACCOUNT SO SHE GAVE ME THE # TO CALL TO SPEAK WITH SOMEONE IN THE [facility] DEPT AND THEN SHE TRANSFERRED ME (# [phone]). THEN I SPOKE WITH AUDREY J. WITH [facility] (SHE STATED SHE IS A 3RD PARTY TO [employer] AND SHE JUST HANDLES [facility]S) SHE STATED THAT FOR CPT CODE 48185 IS NOT ON HER LIST THAT REQUIRE AUTH, BUT WITH THE DISCLAIMER SHE STATS TO CALL [facility] AND CHECK WITH THEM BUT SINCE I HAVE ALREADY TALKED WITH SOMEONE THERE SHE SAYS THAT IM GOOD TO GO JUST CHECK BENEFITS.  REF # SHATARRA P 09/08/21 REF # [id number]

## 2021-09-11 ENCOUNTER — Other Ambulatory Visit: Payer: Self-pay | Admitting: Podiatry

## 2021-09-11 ENCOUNTER — Other Ambulatory Visit: Payer: Self-pay

## 2021-09-11 ENCOUNTER — Ambulatory Visit (INDEPENDENT_AMBULATORY_CARE_PROVIDER_SITE_OTHER): Payer: 59 | Admitting: Podiatry

## 2021-09-11 DIAGNOSIS — D2371 Other benign neoplasm of skin of right lower limb, including hip: Secondary | ICD-10-CM | POA: Diagnosis not present

## 2021-09-11 NOTE — Progress Notes (Signed)
Patient Name: Kristina Hall [facility] DOB: September 18, 1966  MRN: [id number]   Date of Service: 09/11/2021  Surgeon: Dr. Lanae Crumbly, DPM Assistants: None Pre-operative Diagnosis:  Soft tissue mass left foot Post-operative Diagnosis:  Soft tissue mass left foot Procedures:  1) excision benign lesion less than 1 cm Pathology/Specimens:  soft tissue mass sent to gross pathology Anesthesia: Local Hemostasis: Ankle tourniquet 250 mmHg Estimated Blood Loss: Less than 10 cc Materials: 3-0 Monocryl 3-0 nylon Medications: 5 cc each of 0.5% Marcaine plain 2% lidocaine plain Complications: None  Indications for Procedure:  This is a 55 y.o. female with a history of a laceration on the right plantar forefoot.  She developed a inclusion cyst type soft tissue mass that was recurrent following the healing of this.  Surgical excision was recommended.  All questions were reviewed prior to surgery.  Informed consent was signed and reviewed   Procedure in Detail: Patient was identified and the formal consent was signed and the right lower extremity was confirmed. Patient was brought back to the operating room. Anesthesia was induced. The extremity was prepped and draped in the usual sterile fashion. Timeout was taken to confirm patient name, laterality, and procedure prior to incision.   Attention was then directed to the right foot where the soft tissue mass was encircled using a semielliptical incision.  This carried through skin and subcutaneous tissue ensuring that all portions of the mass were completely excised within this ellipse.  Once the mass had been completely excised it was sent to gross pathology in formalin.  The wound is irrigated and closed in layers using 3-0 vicryl and 3-0 nylon.  The foot was then dressed with Adaptic dry sterile dressings and an Ace wrap under light compression. Patient tolerated the procedure well.   Disposition: Following a period of post-operative monitoring,  patient will be transferred to home.

## 2021-09-12 ENCOUNTER — Telehealth: Payer: Self-pay | Admitting: *Deleted

## 2021-09-12 NOTE — Telephone Encounter (Signed)
Received a specimen and an order, but top of the order was cut off and can't see which location of T[facility] is it. Please advise.  Returned call - line was busy, will try again later.

## 2021-09-16 LAB — TISSUE SPECIMEN

## 2021-09-16 LAB — PATHOLOGY REPORT

## 2021-09-17 ENCOUNTER — Ambulatory Visit: Payer: 59 | Admitting: Podiatry

## 2021-09-17 ENCOUNTER — Other Ambulatory Visit: Payer: Self-pay

## 2021-09-17 ENCOUNTER — Encounter: Payer: Self-pay | Admitting: Podiatry

## 2021-09-17 DIAGNOSIS — D2371 Other benign neoplasm of skin of right lower limb, including hip: Secondary | ICD-10-CM

## 2021-09-18 ENCOUNTER — Encounter: Payer: Self-pay | Admitting: Podiatry

## 2021-09-18 NOTE — Progress Notes (Signed)
°  °  Subjective:  Patient ID: Kristina Hall, female    DOB: 1966/03/13,  MRN: [id number]  Chief Complaint  Patient presents with   Routine Post Op     POV #1 DOS 09/11/2021 EXCISION OF SOFT TISSUE MASS RT GREAT TOE "It's good."     55 y.o. female returns for post-op check.  Doing okay the dressing had to be changed but she is now having lots of pain pathology results show that this was an epidermal inclusion cyst  Review of Systems: Negative except as noted in the HPI. Denies N/V/F/Ch.   Objective:  There were no vitals filed for this visit. There is no height or weight on file to calculate BMI. Constitutional Well developed. Well nourished.  Vascular Foot warm and well perfused. Capillary refill normal to all digits.  Calf is soft and supple, no posterior calf or knee pain, negative Homans' sign  Neurologic Normal speech. Oriented to person, place, and time. Epicritic sensation to light touch grossly present bilaterally.  Dermatologic Skin healing well without signs of infection. Skin edges well coapted without signs of infection.  Orthopedic: Tenderness to palpation noted about the surgical site.   Pathology results show this was an epidermal inclusion cyst Assessment:   1. Benign neoplasm of skin of right lower extremity    Plan:  Patient was evaluated and treated and all questions answered.  S/p foot surgery right -Progressing as expected post-operatively. -Pathology results reviewed with her -WB Status: Minimal WB in CAM boot -Sutures: Removed in 2 weeks. -Medications: No refills required -Foot redressed.  She may begin bathing on Monday  Return in about 2 weeks (around 10/01/2021) for suture removal.

## 2021-10-01 ENCOUNTER — Other Ambulatory Visit: Payer: Self-pay

## 2021-10-01 ENCOUNTER — Ambulatory Visit (INDEPENDENT_AMBULATORY_CARE_PROVIDER_SITE_OTHER): Payer: 59 | Admitting: Podiatry

## 2021-10-01 ENCOUNTER — Encounter: Payer: 59 | Admitting: Podiatry

## 2021-10-01 DIAGNOSIS — D2371 Other benign neoplasm of skin of right lower limb, including hip: Secondary | ICD-10-CM

## 2021-10-06 ENCOUNTER — Encounter: Payer: Self-pay | Admitting: Podiatry

## 2021-10-06 NOTE — Progress Notes (Signed)
°  °  Subjective:  Patient ID: Kristina Hall, female    DOB: 1966/01/05,  MRN: [id number]  Chief Complaint  Patient presents with   skin lesion      POV #2 DOS 09/11/2021 EXCISION OF SOFT TISSUE MASS RT GREAT TOE     56 y.o. female returns for post-op check.  Doing well pain is improved quite a bit  Review of Systems: Negative except as noted in the HPI. Denies N/V/F/Ch.   Objective:  There were no vitals filed for this visit. There is no height or weight on file to calculate BMI. Constitutional Well developed. Well nourished.  Vascular Foot warm and well perfused. Capillary refill normal to all digits.  Calf is soft and supple, no posterior calf or knee pain, negative Homans' sign  Neurologic Normal speech. Oriented to person, place, and time. Epicritic sensation to light touch grossly present bilaterally.  Dermatologic Skin healing well without signs of infection. Skin edges well coapted without signs of infection.  Orthopedic: Tenderness to palpation noted about the surgical site.   Pathology results show this was an epidermal inclusion cyst Assessment:   1. Benign neoplasm of skin of right lower extremity    Plan:  Patient was evaluated and treated and all questions answered.  S/p foot surgery right -Sutures removed today she can apply lotion and/or silicone ointment to the incision, return to see me as needed if she has any issues with this or other problems develop  Return if symptoms worsen or fail to improve.

## 2021-10-22 ENCOUNTER — Encounter: Payer: Self-pay | Admitting: Podiatry

## 2021-10-22 ENCOUNTER — Encounter: Payer: 59 | Admitting: Podiatry

## 2021-11-04 ENCOUNTER — Encounter: Payer: Self-pay | Admitting: Family

## 2021-11-04 ENCOUNTER — Telehealth (INDEPENDENT_AMBULATORY_CARE_PROVIDER_SITE_OTHER): Payer: 59 | Admitting: Family

## 2021-11-04 VITALS — Temp 99.6°F | Ht 66.0 in

## 2021-11-04 DIAGNOSIS — M791 Myalgia, unspecified site: Secondary | ICD-10-CM

## 2021-11-04 DIAGNOSIS — J029 Acute pharyngitis, unspecified: Secondary | ICD-10-CM

## 2021-11-04 DIAGNOSIS — R509 Fever, unspecified: Secondary | ICD-10-CM | POA: Diagnosis not present

## 2021-11-04 LAB — POC INFLUENZA A&B (BINAX/QUICKVUE)
Influenza A, POC: NEGATIVE
Influenza B, POC: NEGATIVE

## 2021-11-04 LAB — POC COVID19 BINAXNOW: SARS Coronavirus 2 Ag: NEGATIVE

## 2021-11-04 MED ORDER — METHYLPREDNISOLONE 4 MG PO TBPK: ORAL_TABLET | ORAL | 0 refills | Status: DC

## 2021-11-05 ENCOUNTER — Telehealth: Payer: Self-pay | Admitting: Internal Medicine

## 2021-11-05 DIAGNOSIS — J020 Streptococcal pharyngitis: Secondary | ICD-10-CM

## 2021-11-05 LAB — COVID-19, FLU A+B AND RSV
Influenza A, NAA: NOT DETECTED
Influenza B, NAA: NOT DETECTED
RSV, NAA: NOT DETECTED
SARS-CoV-2, NAA: NOT DETECTED

## 2021-11-05 LAB — POCT RAPID STREP A (OFFICE): Rapid Strep A Screen: POSITIVE — AB

## 2021-11-05 MED ORDER — AMOXICILLIN-POT CLAVULANATE 875-125 MG PO TABS: 1.0000 | ORAL_TABLET | Freq: Two times a day (BID) | ORAL | 0 refills | Status: DC

## 2021-11-05 NOTE — Telephone Encounter (Signed)
Patient strep positive order in your name.

## 2021-11-05 NOTE — Progress Notes (Signed)
poct

## 2021-11-05 NOTE — Telephone Encounter (Signed)
MyChart message sent  : augmentin x 1 week,  sent to [facility]  Daily use of Probiotics for  3 weeks advised to reduce risk of C dificile colitis.

## 2021-11-06 NOTE — Progress Notes (Signed)
° °  Virtual Visit via Video   I connected with patient on 11/06/21 at  1:15 PM EST by a video enabled telemedicine application and verified that I am speaking with the correct person using two identifiers.  Location patient: Home Location provider: [employer], Office Persons participating in the virtual visit: Patient, Provider, CMA  I discussed the limitations of evaluation and management by telemedicine and the availability of in person appointments. The patient expressed understanding and agreed to proceed.  Subjective:   HPI:  Patient is in today with c/o fever, chills, body aches, sore throat, headache, dizziness x 2 days. She has been taking Ibuprofen that helps reduce her fever. Reports feeling pressure in her sinuses.    ROS:   See pertinent positives and negatives per HPI.  Patient Active Problem List   Diagnosis Date Noted   Acute traumatic injury of chest wall 01/21/2021   Injury of right great toe 01/21/2021   Allergic conjunctivitis and rhinitis, left 02/02/2020   Sinus bradycardia 11/07/2019   Nasal polyp, posterior 07/26/2015    Social History   Tobacco Use   Smoking status: Every Day    Packs/day: 0.25    Types: Cigarettes   Smokeless tobacco: Never   Tobacco comments:    6 ciggs a day  Substance Use Topics   Alcohol use: Yes    Alcohol/week: 1.0 standard drink    Types: 1 Glasses of wine per week    Comment: occassionally    Current Outpatient Medications:    acetaminophen (TYLENOL) 650 MG CR tablet, Take 650 mg by mouth every 8 (eight) hours as needed for pain., Disp: , Rfl:    methylPREDNISolone (MEDROL DOSEPAK) 4 MG TBPK tablet, As directed, Disp: 21 tablet, Rfl: 0   mupirocin ointment (BACTROBAN) 2 %, Apply 1 application topically daily., Disp: 30 g, Rfl: 2   VYZULTA 0.024 % SOLN, Apply 1 drop to eye at bedtime., Disp: , Rfl:    amoxicillin-clavulanate (AUGMENTIN) 875-125 MG tablet, Take 1 tablet by mouth 2 (two) times daily.,  Disp: 14 tablet, Rfl: 0  No Known Allergies  Objective:   Temp 99.6 F (37.6 C) (Oral)    Ht 5\' 6"  (1.676 m)    BMI 33.25 kg/m   Patient is well-developed, well-nourished in no acute distress.  Resting comfortable at home.  Head is normocephalic, atraumatic.  No labored breathing.  Speech is clear and coherent with logical content.  Patient is alert and oriented at baseline.    Assessment and Plan:    Clairessa was seen today for chills, generalized body aches, sore throat, headache, dizziness and fever.  Diagnoses and all orders for this visit:  Fever and chills -     POC Influenza A&B (Binax test) -     POC COVID-19 -     COVID-19, Flu A+B and RSV  Myalgia -     POC Influenza A&B (Binax test) -     POC COVID-19 -     COVID-19, Flu A+B and RSV  Sore throat -     POCT Rapid Strep A -     POCT rapid strep A  Other orders -     methylPREDNISolone (MEDROL DOSEPAK) 4 MG TBPK tablet; As directed     Call the with any questions or concerns. Recheck as scheduled and sooner as needed.  Kennyth Arnold, FNP 11/06/2021

## 2021-11-06 NOTE — Telephone Encounter (Signed)
Patient notified

## 2021-11-11 ENCOUNTER — Telehealth: Payer: Self-pay

## 2021-11-11 NOTE — Telephone Encounter (Signed)
Pt stated that she will give it some more time and if she is not better by Thursday she will schedule an appt.

## 2021-11-11 NOTE — Telephone Encounter (Signed)
Pt tested positive for strep throat on 11/04/2021. Pt has completed the prednisone and has one more left of the antibiotics. Pt stated that she still has a scratchy throat on the right side and her headache has returned. Pt was wondering if she needed anther round of antibiotics or needs to be retested.

## 2022-03-18 ENCOUNTER — Telehealth: Payer: Self-pay

## 2022-03-18 NOTE — Telephone Encounter (Signed)
Secure chat from Dr. Derrel Nip:    Kristina Hall does not have documentation in her chart or in [facility] that she has received a Tdap. Is it okay to give to her?     Per message from Dr. Derrel Nip is it okay to give to pt.

## 2022-04-29 ENCOUNTER — Encounter: Payer: Self-pay | Admitting: Internal Medicine

## 2022-04-29 ENCOUNTER — Ambulatory Visit: Payer: 59 | Admitting: Internal Medicine

## 2022-04-29 VITALS — BP 112/78 | HR 60 | Temp 98.5°F | Ht 66.0 in | Wt 209.4 lb

## 2022-04-29 DIAGNOSIS — J0100 Acute maxillary sinusitis, unspecified: Secondary | ICD-10-CM

## 2022-04-29 DIAGNOSIS — J029 Acute pharyngitis, unspecified: Secondary | ICD-10-CM

## 2022-04-29 DIAGNOSIS — J019 Acute sinusitis, unspecified: Secondary | ICD-10-CM | POA: Insufficient documentation

## 2022-04-29 LAB — POCT INFLUENZA A/B
Influenza A, POC: NEGATIVE
Influenza B, POC: NEGATIVE

## 2022-04-29 LAB — POCT RAPID STREP A (OFFICE): Rapid Strep A Screen: NEGATIVE

## 2022-04-29 LAB — POC COVID19 BINAXNOW: SARS Coronavirus 2 Ag: NEGATIVE

## 2022-04-29 MED ORDER — MOMETASONE FUROATE 50 MCG/ACT NA SUSP: 2.0000 | Freq: Every day | NASAL | 12 refills | Status: DC

## 2022-04-29 MED ORDER — AMOXICILLIN-POT CLAVULANATE 875-125 MG PO TABS: 1.0000 | ORAL_TABLET | Freq: Two times a day (BID) | ORAL | 0 refills | Status: DC

## 2022-04-29 NOTE — Patient Instructions (Addendum)
Augmentin twice daily   for 7 days.  Take with  food  Use Milta Deiters Med's sinus rinse once daily  Use the steroid nasal spray every other day    Consider a chin strap to prevent snoring

## 2022-04-29 NOTE — Progress Notes (Signed)
Subjective:  Patient ID: Geralyn Corwin, female    DOB: [birth year]  Age: 56 y.o. MRN: [id number]  CC: The primary encounter diagnosis was Sore throat. A diagnosis of Acute non-recurrent maxillary sinusitis was also pertinent to this visit.   HPI Nile Dor[facility] presents for  Chief Complaint  Patient presents with   Acute Visit    Sore throat, low grade fever last night.     History of left maxillary polyp, non operable per ENT.   MONDAY AFTERNOON  developed body aches, scratchy throat.  Tuesday had recurrent nausea,  sinus drainage, fatigue subjective fevers and chills  , but temp was normal.  Went to bed early last night.  Feeling a little better today tmax 99.7,  but left sinus is tender and pain is tracking down left side of face.   Sick contacts:  grandson over the weekend.    Outpatient Medications Prior to Visit  Medication Sig Dispense Refill   acetaminophen (TYLENOL) 650 MG CR tablet Take 650 mg by mouth every 8 (eight) hours as needed for pain.     Calcium Carbonate-Vit D-Min (CALCIUM 1200 PO) Take 1 tablet by mouth daily.     Lactobacillus (PROBIOTIC ACIDOPHILUS PO) Take 1 capsule by mouth daily.     VYZULTA 0.024 % SOLN Apply 1 drop to eye at bedtime.     amoxicillin-clavulanate (AUGMENTIN) 875-125 MG tablet Take 1 tablet by mouth 2 (two) times daily. (Patient not taking: Reported on 04/29/2022) 14 tablet 0   methylPREDNISolone (MEDROL DOSEPAK) 4 MG TBPK tablet As directed (Patient not taking: Reported on 04/29/2022) 21 tablet 0   mupirocin ointment (BACTROBAN) 2 % Apply 1 application topically daily. (Patient not taking: Reported on 04/29/2022) 30 g 2   No facility-administered medications prior to visit.    Review of Systems;  Patient denies headache, fevers, malaise, unintentional weight loss, skin rash, eye pain, sinus congestion and sinus pain, sore throat, dysphagia,  hemoptysis , cough, dyspnea, wheezing, chest pain, palpitations,  orthopnea, edema, abdominal pain, nausea, melena, diarrhea, constipation, flank pain, dysuria, hematuria, urinary  Frequency, nocturia, numbness, tingling, seizures,  Focal weakness, Loss of consciousness,  Tremor, insomnia, depression, anxiety, and suicidal ideation.      Objective:  BP 112/78 (BP Location: Left Arm, Patient Position: Sitting, Cuff Size: Normal)   Pulse 60   Temp 98.5 F (36.9 C) (Oral)   Ht '5\' 6"'$  (1.676 m)   Wt 209 lb 6.4 oz (95 kg)   SpO2 96%   BMI 33.80 kg/m   BP Readings from Last 3 Encounters:  04/29/22 112/78  01/21/21 112/76  12/07/19 120/84    Wt Readings from Last 3 Encounters:  04/29/22 209 lb 6.4 oz (95 kg)  01/21/21 206 lb (93.4 kg)  02/02/20 204 lb (92.5 kg)    General appearance: alert, cooperative and appears stated age Ears: normal TM's and external ear canals both ears Face:  left maxillary sinus tenderness  Throat: tonsillar erythema without edema or ulcerations.  lips, mucosa, and tongue normal; teeth and gums normal Neck: no adenopathy, no carotid bruit, supple, symmetrical, trachea midline and thyroid not enlarged, symmetric, no tenderness/mass/nodules Back: symmetric, no curvature. ROM normal. No CVA tenderness. Lungs: clear to auscultation bilaterally Heart: regular rate and rhythm, S1, S2 normal, no murmur, click, rub or gallop Abdomen: soft, non-tender; bowel sounds normal; no masses,  no organomegaly Pulses: 2+ and symmetric Skin: Skin color, texture, turgor normal. No rashes or lesions Lymph nodes: Cervical, supraclavicular, and axillary nodes  normal.  No results found for: "HGBA1C"  Lab Results  Component Value Date   CREATININE 0.73 10/25/2019   CREATININE 0.84 01/08/2017   CREATININE 0.75 07/08/2014    Lab Results  Component Value Date   WBC 6.4 11/08/2019   HGB 15.1 (H) 11/08/2019   HCT 45.4 11/08/2019   PLT 205.0 11/08/2019   GLUCOSE 92 10/25/2019   CHOL 186 11/08/2019   TRIG 35.0 11/08/2019   HDL 69.00  11/08/2019   LDLCALC 110 (H) 11/08/2019   ALT 9 01/08/2017   AST 16 01/08/2017   NA 141 10/25/2019   K 3.7 10/25/2019   CL 106 10/25/2019   CREATININE 0.73 10/25/2019   BUN 18 10/25/2019   CO2 26 10/25/2019   TSH 2.09 11/08/2019    DG Finger Thumb Right  Result Date: 02/04/2015 CLINICAL DATA:  Pain.  No reported injury.  Initial evaluation. EXAM: RIGHT THUMB 2+V COMPARISON:  None. FINDINGS: No acute bony or joint abnormality identified. No evidence of fracture or dislocation. IMPRESSION: No acute abnormality. Electronically Signed   By: Marcello Moores  Register   On: 02/04/2015 16:12    Assessment & Plan:   Problem List Items Addressed This Visit     Acute sinusitis    Left maxillary.  COVID,  Strep  And rapid flu are negative.  Will treat with augmentin ,  Add every other day nasal steroids,  And daily irrigation with Welford Roche       Relevant Medications   amoxicillin-clavulanate (AUGMENTIN) 875-125 MG tablet   mometasone (NASONEX) 50 MCG/ACT nasal spray   Other Visit Diagnoses     Sore throat    -  Primary   Relevant Orders   POCT rapid strep A (Completed)   POCT Influenza A/B (Completed)   POC COVID-19 (Completed)       Follow-up: No follow-ups on file.   Crecencio Mc, MD

## 2022-04-29 NOTE — Assessment & Plan Note (Signed)
Left maxillary.  COVID,  Strep  And rapid flu are negative.  Will treat with augmentin ,  Add every other day nasal steroids,  And daily irrigation with Welford Roche

## 2022-05-08 ENCOUNTER — Other Ambulatory Visit: Payer: Self-pay | Admitting: Internal Medicine

## 2022-05-08 MED ORDER — ALPRAZOLAM 0.25 MG PO TABS: 0.2500 mg | ORAL_TABLET | Freq: Two times a day (BID) | ORAL | 0 refills | Status: DC | PRN

## 2022-07-09 ENCOUNTER — Ambulatory Visit (INDEPENDENT_AMBULATORY_CARE_PROVIDER_SITE_OTHER): Payer: 59

## 2022-07-09 ENCOUNTER — Ambulatory Visit: Payer: 59 | Admitting: Internal Medicine

## 2022-07-09 ENCOUNTER — Encounter: Payer: Self-pay | Admitting: Internal Medicine

## 2022-07-09 VITALS — BP 122/74 | HR 59 | Temp 98.2°F | Ht 66.0 in | Wt 211.2 lb

## 2022-07-09 DIAGNOSIS — M25471 Effusion, right ankle: Secondary | ICD-10-CM | POA: Insufficient documentation

## 2022-07-09 LAB — CBC WITH DIFFERENTIAL/PLATELET
Basophils Absolute: 0 10*3/uL (ref 0.0–0.1)
Basophils Relative: 0.4 % (ref 0.0–3.0)
Eosinophils Absolute: 0.1 10*3/uL (ref 0.0–0.7)
Eosinophils Relative: 0.7 % (ref 0.0–5.0)
HCT: 41.1 % (ref 36.0–46.0)
Hemoglobin: 13.7 g/dL (ref 12.0–15.0)
Lymphocytes Relative: 20.2 % (ref 12.0–46.0)
Lymphs Abs: 2 10*3/uL (ref 0.7–4.0)
MCHC: 33.3 g/dL (ref 30.0–36.0)
MCV: 90.6 fl (ref 78.0–100.0)
Monocytes Absolute: 0.7 10*3/uL (ref 0.1–1.0)
Monocytes Relative: 7.3 % (ref 3.0–12.0)
Neutro Abs: 7 10*3/uL (ref 1.4–7.7)
Neutrophils Relative %: 71.4 % (ref 43.0–77.0)
Platelets: 214 10*3/uL (ref 150.0–400.0)
RBC: 4.53 Mil/uL (ref 3.87–5.11)
RDW: 13.3 % (ref 11.5–15.5)
WBC: 9.9 10*3/uL (ref 4.0–10.5)

## 2022-07-09 LAB — C-REACTIVE PROTEIN: CRP: 1.1 mg/dL (ref 0.5–20.0)

## 2022-07-09 LAB — URIC ACID: Uric Acid, Serum: 4.1 mg/dL (ref 2.4–7.0)

## 2022-07-09 LAB — SEDIMENTATION RATE: Sed Rate: 26 mm/hr (ref 0–30)

## 2022-07-09 MED ORDER — CEPHALEXIN 500 MG PO CAPS: 500.0000 mg | ORAL_CAPSULE | Freq: Four times a day (QID) | ORAL | 0 refills | Status: DC

## 2022-07-09 NOTE — Patient Instructions (Signed)
I'm treating you with Cephalexin for 7 days to treat cellulitis  Ice, elevation for the swelling   X rays and labs to rule out other causes

## 2022-07-09 NOTE — Progress Notes (Signed)
Subjective:  Patient ID: Kristina Hall, female    DOB: 1966-01-30  Age: 56 y.o. MRN: [id number]  CC: The encounter diagnosis was Ankle swelling, right.   HPI Tyasia [facility] presents for  Chief Complaint  Patient presents with   right foot pain   56 yr old female with no history of gout or ankle injury presents with pain, redness of right ankle  that  began  2 days ago,  was present upon waking.  She took ibuprofen 200 mg x 2 doses throughout the day which seemed to have helped. 48 hours later , today,   she woke up with new onset swelling and warmth . Acitivities and diet reviewed:     Over the weekend  ate steak , on Monday ate chicken,  no wine or cheese .  No unusual activity over the weekend.  No insect bites that she can recall .  Dog scratched her heel on Monday but apparently did not break the skin   History of right toe skin biopsy Dec 2022    Outpatient Medications Prior to Visit  Medication Sig Dispense Refill   acetaminophen (TYLENOL) 650 MG CR tablet Take 650 mg by mouth every 8 (eight) hours as needed for pain.     ALPRAZolam (XANAX) 0.25 MG tablet Take 1 tablet (0.25 mg total) by mouth 2 (two) times daily as needed for anxiety. 10 tablet 0   Calcium Carbonate-Vit D-Min (CALCIUM 1200 PO) Take 1 tablet by mouth daily.     Lactobacillus (PROBIOTIC ACIDOPHILUS PO) Take 1 capsule by mouth daily.     mometasone (NASONEX) 50 MCG/ACT nasal spray Place 2 sprays into the nose daily. 1 each 12   VYZULTA 0.024 % SOLN Apply 1 drop to eye at bedtime.     amoxicillin-clavulanate (AUGMENTIN) 875-125 MG tablet Take 1 tablet by mouth 2 (two) times daily. (Patient not taking: Reported on 07/09/2022) 14 tablet 0   No facility-administered medications prior to visit.    Review of Systems;  Patient denies headache, fevers, malaise, unintentional weight loss, skin rash, eye pain, sinus congestion and sinus pain, sore throat, dysphagia,  hemoptysis , cough, dyspnea,  wheezing, chest pain, palpitations, orthopnea, edema, abdominal pain, nausea, melena, diarrhea, constipation, flank pain, dysuria, hematuria, urinary  Frequency, nocturia, numbness, tingling, seizures,  Focal weakness, Loss of consciousness,  Tremor, insomnia, depression, anxiety, and suicidal ideation.      Objective:  BP 122/74 (BP Location: Left Arm, Patient Position: Sitting, Cuff Size: Normal)   Pulse (!) 59   Temp 98.2 F (36.8 C) (Oral)   Ht '5\' 6"'$  (1.676 m)   Wt 211 lb 3.2 oz (95.8 kg)   SpO2 98%   BMI 34.09 kg/m   BP Readings from Last 3 Encounters:  07/09/22 122/74  04/29/22 112/78  01/21/21 112/76    Wt Readings from Last 3 Encounters:  07/09/22 211 lb 3.2 oz (95.8 kg)  04/29/22 209 lb 6.4 oz (95 kg)  01/21/21 206 lb (93.4 kg)    General appearance: alert, cooperative and appears stated age Ears: normal TM's and external ear canals both ears Throat: lips, mucosa, and tongue normal; teeth and gums normal Neck: no adenopathy, no carotid bruit, supple, symmetrical, trachea midline and thyroid not enlarged, symmetric, no tenderness/mass/nodules Back: symmetric, no curvature. ROM normal. No CVA tenderness. Lungs: clear to auscultation bilaterally Heart: regular rate and rhythm, S1, S2 normal, no murmur, click, rub or gallop Abdomen: soft, non-tender; bowel sounds normal; no masses,  no  organomegaly Pulses: 2+ and symmetric Skin: Skin color, texture, turgor normal. No rashes or lesions Lymph nodes: Cervical, supraclavicular, and axillary nodes normal. Ext: right ankle with full ROM,  no bruising   n pitting edema diffuse,  with focal redness and warmth of Achilles tendon .   No results found for: "HGBA1C"  Lab Results  Component Value Date   CREATININE 0.73 10/25/2019   CREATININE 0.84 01/08/2017   CREATININE 0.75 07/08/2014    Lab Results  Component Value Date   WBC 6.4 11/08/2019   HGB 15.1 (H) 11/08/2019   HCT 45.4 11/08/2019   PLT 205.0 11/08/2019    GLUCOSE 92 10/25/2019   CHOL 186 11/08/2019   TRIG 35.0 11/08/2019   HDL 69.00 11/08/2019   LDLCALC 110 (H) 11/08/2019   ALT 9 01/08/2017   AST 16 01/08/2017   NA 141 10/25/2019   K 3.7 10/25/2019   CL 106 10/25/2019   CREATININE 0.73 10/25/2019   BUN 18 10/25/2019   CO2 26 10/25/2019   TSH 2.09 11/08/2019    DG Finger Thumb Right  Result Date: 02/04/2015 CLINICAL DATA:  Pain.  No reported injury.  Initial evaluation. EXAM: RIGHT THUMB 2+V COMPARISON:  None. FINDINGS: No acute bony or joint abnormality identified. No evidence of fracture or dislocation. IMPRESSION: No acute abnormality. Electronically Signed   By: Marcello Moores  Register   On: 02/04/2015 16:12    Assessment & Plan:   Problem List Items Addressed This Visit     Ankle swelling, right - Primary    ddx includes cellulitis from dog scratch or insect bite ,,  Gout .  Plain films, labs and empiric abx prescribed      Relevant Orders   CBC with Differential/Platelet   Sedimentation rate   C-reactive protein   Uric acid   DG Ankle Complete Right    Follow-up: No follow-ups on file.   Crecencio Mc, MD

## 2022-07-09 NOTE — Assessment & Plan Note (Signed)
ddx includes cellulitis from dog scratch or insect bite ,,  Gout .  Plain films, labs and empiric abx prescribed

## 2022-07-20 IMAGING — DX DG TOE GREAT 2+V*R*
3 series · 3 of 3 positions shown · non-contrast
Comparison: None.

CLINICAL DATA: 54-year-old female with fall and pain over the great
toe.

EXAM:
RIGHT GREAT TOE

[toes ap]
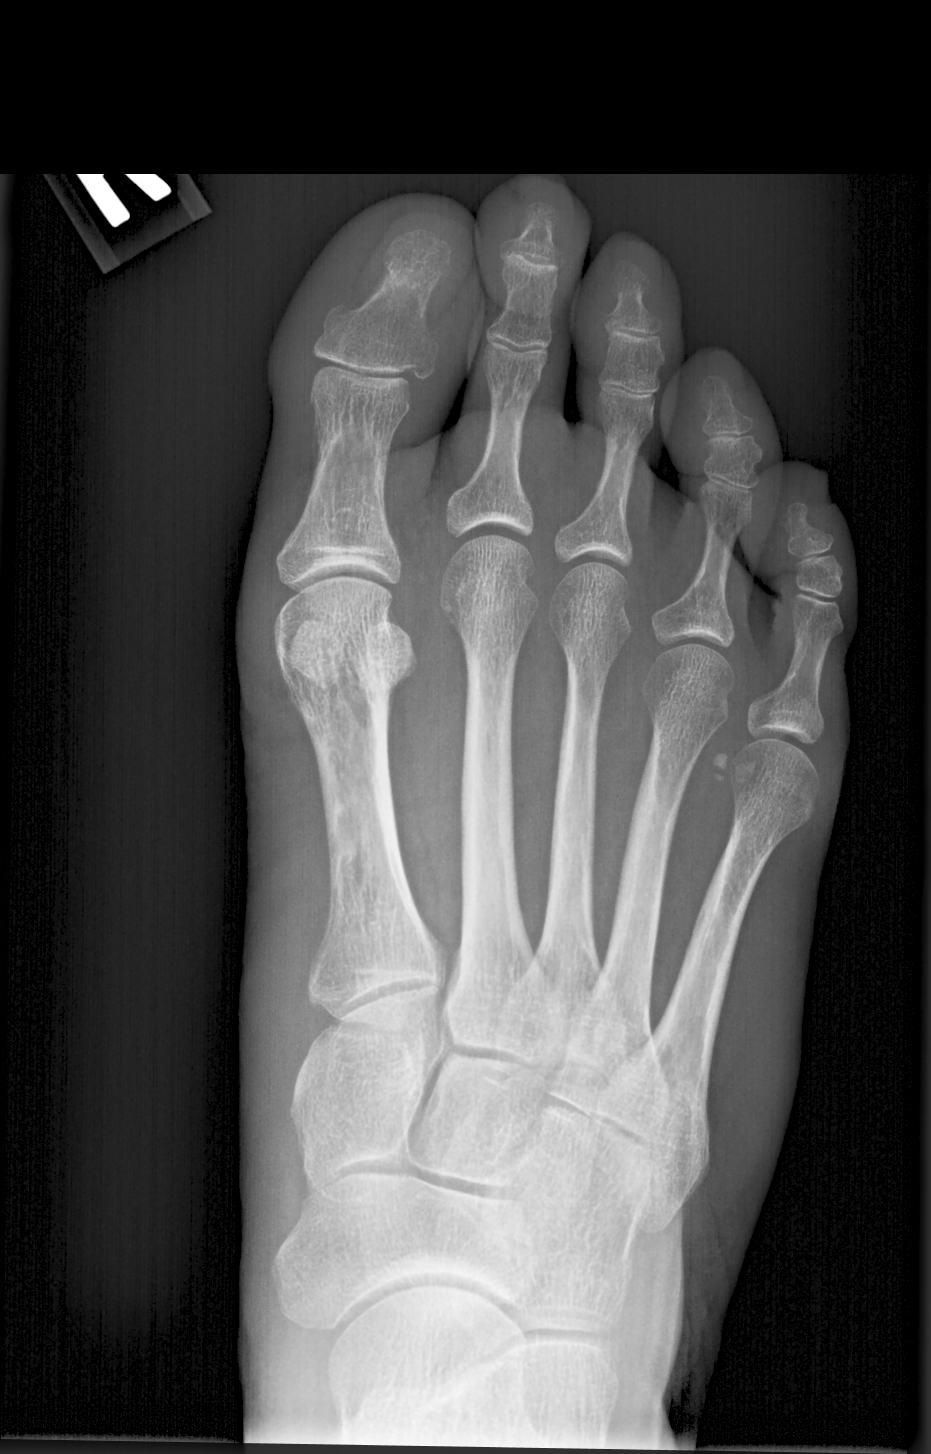

[toes obl (oblique)]
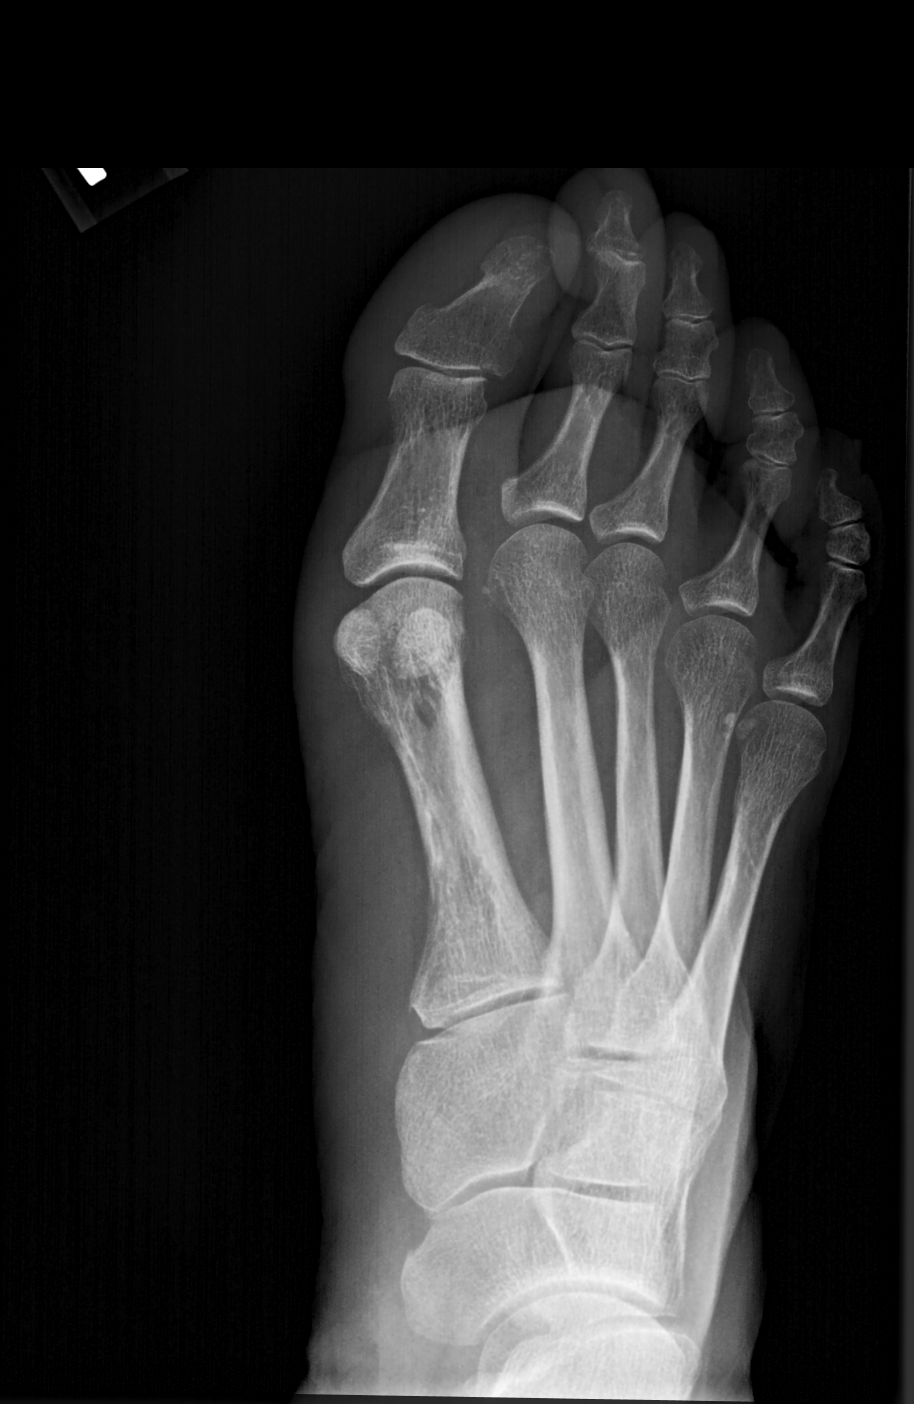

[toes lat]
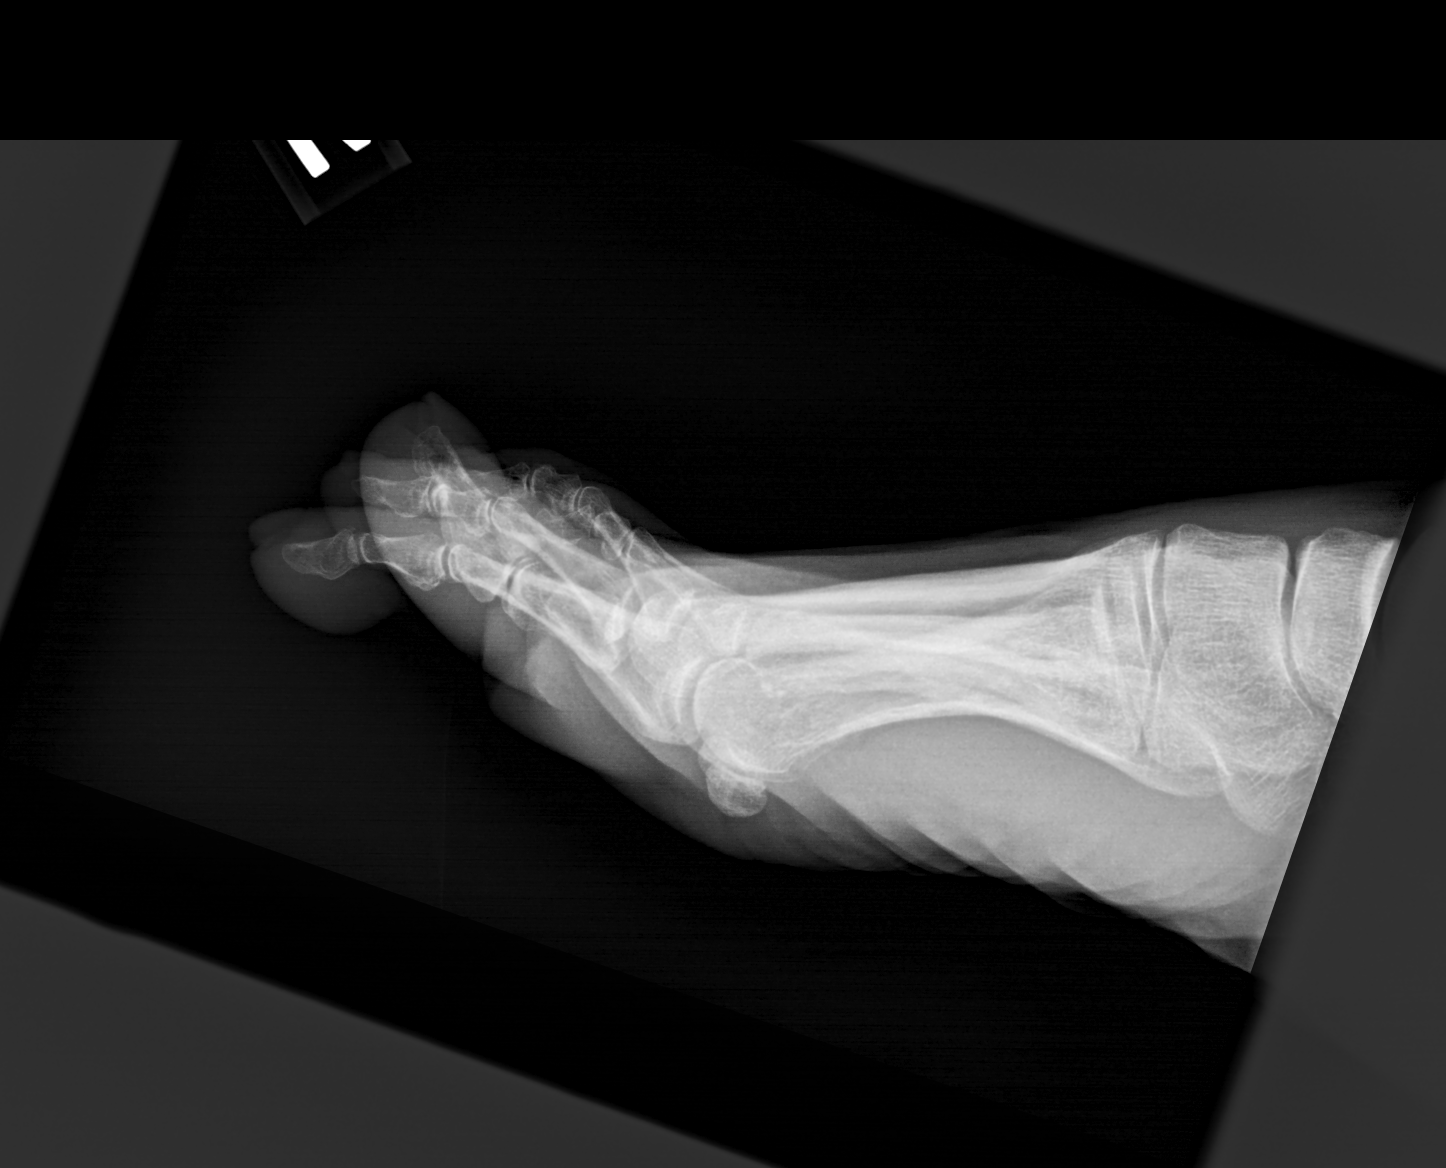

[3 of 3 positions shown; findings below may reference images not displayed]

FINDINGS: Focal area of irregularity of the lateral base of the distal phalanx
of the great toe may be chronic or represent a nondisplaced cortical
fracture. Correlation point tenderness recommended. No other acute
fracture identified. There is no dislocation. No significant
arthritic changes. The soft tissues are grossly unremarkable.
IMPRESSION: Chronic changes versus a nondisplaced fracture of the lateral base
of the distal phalanx of the great toe.

## 2022-12-25 ENCOUNTER — Ambulatory Visit: Payer: 59 | Admitting: Nurse Practitioner

## 2022-12-25 VITALS — BP 118/78 | HR 39 | Temp 98.6°F | Ht 65.0 in | Wt 209.6 lb

## 2022-12-25 DIAGNOSIS — J040 Acute laryngitis: Secondary | ICD-10-CM

## 2022-12-25 DIAGNOSIS — H6690 Otitis media, unspecified, unspecified ear: Secondary | ICD-10-CM

## 2022-12-25 MED ORDER — AMOXICILLIN-POT CLAVULANATE 875-125 MG PO TABS: 1.0000 | ORAL_TABLET | Freq: Two times a day (BID) | ORAL | 0 refills | Status: DC

## 2022-12-25 MED ORDER — FEXOFENADINE HCL 60 MG PO TABS: 60.0000 mg | ORAL_TABLET | Freq: Two times a day (BID) | ORAL | 1 refills | Status: DC

## 2022-12-25 NOTE — Progress Notes (Unsigned)
Established Patient Office Visit  Subjective:  Patient ID: Kristina Hall, female    DOB: [birth year]  Age: 57 y.o. MRN: [id number]  CC:  Chief Complaint  Patient presents with   Acute Visit    Voice gone/sinus drainage X 4 days     HPI  Shimika Bailer [facility] presents for: ear pain   HPI   Past Medical History:  Diagnosis Date   Nasal polyp     Past Surgical History:  Procedure Laterality Date   supracervical hysterectomy     due to menorrhagia     Family History  Problem Relation Age of Onset   Hypotension Mother    Congestive Heart Failure Father 81       s/p NSTEMI, also has OSA ,  CKD and Hypertension    Congestive Heart Failure Paternal Grandmother        renal calculi   Coronary artery disease Paternal Uncle     Social History   Socioeconomic History   Marital status: Married    Spouse name: Not on file   Number of children: Not on file   Years of education: Not on file   Highest education level: Associate degree: occupational, Hotel manager, or vocational program  Occupational History   Not on file  Tobacco Use   Smoking status: Every Day    Packs/day: .25    Types: Cigarettes   Smokeless tobacco: Never   Tobacco comments:    6 ciggs a day  Vaping Use   Vaping Use: Never used  Substance and Sexual Activity   Alcohol use: Yes    Alcohol/week: 1.0 standard drink of alcohol    Types: 1 Glasses of wine per week    Comment: occassionally   Drug use: Never   Sexual activity: Not on file  Other Topics Concern   Not on file  Social History Narrative   2 childrens aged 17 and 36, both in [place] married   Social Determinants of Health   Financial Resource Strain: Low Risk  (12/25/2022)   Overall Financial Resource Strain (CARDIA)    Difficulty of Paying Living Expenses: Not hard at all  Food Insecurity: No Food Insecurity (12/25/2022)   Hunger Vital Sign    Worried About Running Out of Food in the Last Year: Never true     Ran Out of Food in the Last Year: Never true  Transportation Needs: No Transportation Needs (12/25/2022)   PRAPARE - Hydrologist (Medical): No    Lack of Transportation (Non-Medical): No  Physical Activity: Unknown (12/25/2022)   Exercise Vital Sign    Days of Exercise per Week: 0 days    Minutes of Exercise per Session: Not on file  Stress: No Stress Concern Present (12/25/2022)   [place]    Feeling of Stress : Not at all  Social Connections: Moderately Isolated (12/25/2022)   Social Connection and Isolation Panel [NHANES]    Frequency of Communication with Friends and Family: More than three times a week    Frequency of Social Gatherings with Friends and Family: Twice a week    Attends Religious Services: Never    Marine scientist or Organizations: No    Attends Music therapist: Not on file    Marital Status: Married  Human resources officer Violence: Not on file     Outpatient Medications Prior to Visit  Medication Sig Dispense Refill  acetaminophen (TYLENOL) 650 MG CR tablet Take 650 mg by mouth every 8 (eight) hours as needed for pain.     ALPRAZolam (XANAX) 0.25 MG tablet Take 1 tablet (0.25 mg total) by mouth 2 (two) times daily as needed for anxiety. 10 tablet 0   Calcium Carbonate-Vit D-Min (CALCIUM 1200 PO) Take 1 tablet by mouth daily.     cephALEXin (KEFLEX) 500 MG capsule Take 1 capsule (500 mg total) by mouth 4 (four) times daily. 28 capsule 0   Lactobacillus (PROBIOTIC ACIDOPHILUS PO) Take 1 capsule by mouth daily.     mometasone (NASONEX) 50 MCG/ACT nasal spray Place 2 sprays into the nose daily. 1 each 12   VYZULTA 0.024 % SOLN Apply 1 drop to eye at bedtime.     No facility-administered medications prior to visit.    No Known Allergies  ROS Review of Systems    Objective:    Physical Exam  BP 118/78   Pulse (!) 39   Temp 98.6 F (37 C) (Oral)    Ht 5\' 5"  (1.651 m)   Wt 209 lb 9.6 oz (95.1 kg)   SpO2 96%   BMI 34.88 kg/m  Wt Readings from Last 3 Encounters:  12/25/22 209 lb 9.6 oz (95.1 kg)  07/09/22 211 lb 3.2 oz (95.8 kg)  04/29/22 209 lb 6.4 oz (95 kg)     Health Maintenance  Topic Date Due   HIV Screening  Never done   Hepatitis C Screening  Never done   Zoster Vaccines- Shingrix (1 of 2) Never done   PAP SMEAR-Modifier  Never done   COLONOSCOPY (Pts 45-87yrs Insurance coverage will need to be confirmed)  Never done   MAMMOGRAM  Never done   COVID-19 Vaccine (6 - 2023-24 season) 01/10/2023 (Originally 06/05/2022)   DTaP/Tdap/Td (2 - Td or Tdap) 03/18/2032   INFLUENZA VACCINE  Completed   HPV VACCINES  Aged Out    There are no preventive care reminders to display for this patient.  Lab Results  Component Value Date   TSH 2.09 11/08/2019   Lab Results  Component Value Date   WBC 9.9 07/09/2022   HGB 13.7 07/09/2022   HCT 41.1 07/09/2022   MCV 90.6 07/09/2022   PLT 214.0 07/09/2022   Lab Results  Component Value Date   NA 141 10/25/2019   K 3.7 10/25/2019   CO2 26 10/25/2019   GLUCOSE 92 10/25/2019   BUN 18 10/25/2019   CREATININE 0.73 10/25/2019   BILITOT 0.4 01/08/2017   ALKPHOS 83 01/08/2017   AST 16 01/08/2017   ALT 9 01/08/2017   PROT 6.8 01/08/2017   ALBUMIN 4.5 01/08/2017   CALCIUM 9.1 10/25/2019   ANIONGAP 9 10/25/2019   Lab Results  Component Value Date   CHOL 186 11/08/2019   Lab Results  Component Value Date   HDL 69.00 11/08/2019   Lab Results  Component Value Date   LDLCALC 110 (H) 11/08/2019   Lab Results  Component Value Date   TRIG 35.0 11/08/2019   Lab Results  Component Value Date   CHOLHDL 3 11/08/2019   No results found for: "HGBA1C"    Assessment & Plan:  There are no diagnoses linked to this encounter.  Follow-up: No follow-ups on file.   Theresia Lo, NP

## 2022-12-29 ENCOUNTER — Telehealth: Payer: Self-pay | Admitting: *Deleted

## 2022-12-29 MED ORDER — HYDROCOD POLI-CHLORPHE POLI ER 10-8 MG/5ML PO SUER: ORAL | 0 refills | Status: DC

## 2022-12-29 NOTE — Telephone Encounter (Signed)
Spoke to Kristina Hall.  Not sleeping due to increased cough.  No sob.  Has taken codeine cough syrup previously.  Tussionex rx sent in.  Will need to be seen if persistent issues.

## 2022-12-29 NOTE — Telephone Encounter (Signed)
Kristina Hall was seen by [facility] last week. She is having trouble sleeping at night to the drainage creating a cough.  She is requesting a cough medication with codeine to help at night. [facility] [address].

## 2022-12-29 NOTE — Telephone Encounter (Signed)
Dr. Nicki Reaper wondering if you can help with this.  Kristina Hall is needing some cough medicine with codeine at night.  I'm sending it to you since Dr. Derrel Nip is off and I'm not sure if Karl Bales will see this today.  [facility] on [address] of [address]. Thank you!

## 2022-12-29 NOTE — Telephone Encounter (Signed)
[facility] to [facility] in [facility]. Will be ready for pick up this pm. Changed quantity to 45 ml in order for insurance to cover. Dr Nicki Reaper and patient are aware.

## 2022-12-29 NOTE — Addendum Note (Signed)
Addended by: Alisa Graff on: 12/29/2022 03:01 PM   Modules accepted: Orders

## 2022-12-30 ENCOUNTER — Encounter: Payer: Self-pay | Admitting: Nurse Practitioner

## 2022-12-30 DIAGNOSIS — H6690 Otitis media, unspecified, unspecified ear: Secondary | ICD-10-CM | POA: Insufficient documentation

## 2022-12-30 DIAGNOSIS — J040 Acute laryngitis: Secondary | ICD-10-CM | POA: Insufficient documentation

## 2022-12-30 NOTE — Assessment & Plan Note (Signed)
Right TM erythematous and bulging. Started on Augmentin twice a day for 10 days. Advised to use warm compress to relieve ear pain.

## 2022-12-30 NOTE — Assessment & Plan Note (Signed)
Advised to use plain Mucinex over-the-counter to thin the mucus. Use warm water and salt gargles.   Increase hydration and use steam or humidifier.

## 2022-12-30 NOTE — Telephone Encounter (Signed)
Noted  

## 2022-12-30 NOTE — Telephone Encounter (Signed)
Thank you so much Dr. Nicki Reaper.  How is Kristina Hall doing now.

## 2022-12-30 NOTE — Telephone Encounter (Signed)
Spoke with patient this morning. Was able to get tussionex and slept better last night.

## 2023-01-12 ENCOUNTER — Other Ambulatory Visit (INDEPENDENT_AMBULATORY_CARE_PROVIDER_SITE_OTHER): Payer: 59

## 2023-01-12 ENCOUNTER — Other Ambulatory Visit: Payer: Self-pay | Admitting: Internal Medicine

## 2023-01-12 DIAGNOSIS — H66001 Acute suppurative otitis media without spontaneous rupture of ear drum, right ear: Secondary | ICD-10-CM | POA: Diagnosis not present

## 2023-01-12 MED ORDER — LEVOFLOXACIN 500 MG PO TABS: 500.0000 mg | ORAL_TABLET | Freq: Every day | ORAL | 0 refills | Status: AC

## 2023-01-12 MED ORDER — OMEPRAZOLE 40 MG PO CPDR
40.0000 mg | DELAYED_RELEASE_CAPSULE | Freq: Every day | ORAL | 3 refills | Status: DC
Start: 2023-01-12 — End: 2023-04-09

## 2023-01-12 MED ORDER — PREDNISONE 10 MG PO TABS: ORAL_TABLET | ORAL | 0 refills | Status: DC

## 2023-01-12 NOTE — Assessment & Plan Note (Signed)
Transient relief then recurrence of pain  despite augmentin x 10 days  which was completed 8 days ago.  TM is intact.  Levaquin and prednisone prescribed ; if no I'mproveent will need imaging/ENT referral

## 2023-01-13 LAB — CBC WITH DIFFERENTIAL/PLATELET
Basophils Absolute: 0.1 10*3/uL (ref 0.0–0.1)
Basophils Relative: 1.3 % (ref 0.0–3.0)
Eosinophils Absolute: 0.1 10*3/uL (ref 0.0–0.7)
Eosinophils Relative: 0.9 % (ref 0.0–5.0)
HCT: 43 % (ref 36.0–46.0)
Hemoglobin: 14.6 g/dL (ref 12.0–15.0)
Lymphocytes Relative: 28.8 % (ref 12.0–46.0)
Lymphs Abs: 3 10*3/uL (ref 0.7–4.0)
MCHC: 34 g/dL (ref 30.0–36.0)
MCV: 90.9 fl (ref 78.0–100.0)
Monocytes Absolute: 0.7 10*3/uL (ref 0.1–1.0)
Monocytes Relative: 7.2 % (ref 3.0–12.0)
Neutro Abs: 6.5 10*3/uL (ref 1.4–7.7)
Neutrophils Relative %: 61.8 % (ref 43.0–77.0)
Platelets: 253 10*3/uL (ref 150.0–400.0)
RBC: 4.73 Mil/uL (ref 3.87–5.11)
RDW: 13.2 % (ref 11.5–15.5)
WBC: 10.5 10*3/uL (ref 4.0–10.5)

## 2023-01-13 LAB — SEDIMENTATION RATE: Sed Rate: 26 mm/hr (ref 0–30)

## 2023-04-05 ENCOUNTER — Other Ambulatory Visit: Payer: Self-pay | Admitting: Internal Medicine

## 2023-04-05 ENCOUNTER — Ambulatory Visit: Payer: 59 | Admitting: Internal Medicine

## 2023-04-05 ENCOUNTER — Encounter: Payer: Self-pay | Admitting: Internal Medicine

## 2023-04-05 ENCOUNTER — Encounter: Payer: 59 | Admitting: Family

## 2023-04-05 ENCOUNTER — Ambulatory Visit (INDEPENDENT_AMBULATORY_CARE_PROVIDER_SITE_OTHER): Payer: 59

## 2023-04-05 VITALS — BP 128/74 | HR 62 | Ht 65.0 in | Wt 213.4 lb

## 2023-04-05 DIAGNOSIS — N179 Acute kidney failure, unspecified: Secondary | ICD-10-CM | POA: Diagnosis not present

## 2023-04-05 DIAGNOSIS — N200 Calculus of kidney: Secondary | ICD-10-CM

## 2023-04-05 DIAGNOSIS — R1031 Right lower quadrant pain: Secondary | ICD-10-CM

## 2023-04-05 DIAGNOSIS — N132 Hydronephrosis with renal and ureteral calculous obstruction: Secondary | ICD-10-CM

## 2023-04-05 DIAGNOSIS — R110 Nausea: Secondary | ICD-10-CM

## 2023-04-05 LAB — URINALYSIS, ROUTINE W REFLEX MICROSCOPIC
Bilirubin Urine: NEGATIVE
Ketones, ur: NEGATIVE
Leukocytes,Ua: NEGATIVE
Nitrite: NEGATIVE
Specific Gravity, Urine: 1.025 (ref 1.000–1.030)
Total Protein, Urine: 30 — AB
Urine Glucose: NEGATIVE
Urobilinogen, UA: 0.2 (ref 0.0–1.0)
pH: 6 (ref 5.0–8.0)

## 2023-04-05 LAB — POC URINALSYSI DIPSTICK (AUTOMATED)
Glucose, UA: NEGATIVE
Ketones, UA: NEGATIVE
Leukocytes, UA: NEGATIVE
Nitrite, UA: NEGATIVE
Protein, UA: POSITIVE — AB
Spec Grav, UA: >=1.03 — AB (ref 1.010–1.025)
Urobilinogen, UA: 0.2 E.U./dL
pH, UA: 5.5 (ref 5.0–8.0)

## 2023-04-05 LAB — CBC WITH DIFFERENTIAL/PLATELET
Basophils Absolute: 0 10*3/uL (ref 0.0–0.1)
Basophils Relative: 0.3 % (ref 0.0–3.0)
Eosinophils Absolute: 0.1 10*3/uL (ref 0.0–0.7)
Eosinophils Relative: 1.1 % (ref 0.0–5.0)
HCT: 40.3 % (ref 36.0–46.0)
Hemoglobin: 13.4 g/dL (ref 12.0–15.0)
Lymphocytes Relative: 15.3 % (ref 12.0–46.0)
Lymphs Abs: 1.7 10*3/uL (ref 0.7–4.0)
MCHC: 33.1 g/dL (ref 30.0–36.0)
MCV: 90.3 fl (ref 78.0–100.0)
Monocytes Absolute: 1.1 10*3/uL — ABNORMAL HIGH (ref 0.1–1.0)
Monocytes Relative: 9.6 % (ref 3.0–12.0)
Neutro Abs: 8.2 10*3/uL — ABNORMAL HIGH (ref 1.4–7.7)
Neutrophils Relative %: 73.7 % (ref 43.0–77.0)
Platelets: 201 10*3/uL (ref 150.0–400.0)
RBC: 4.47 Mil/uL (ref 3.87–5.11)
RDW: 13.2 % (ref 11.5–15.5)
WBC: 11.1 10*3/uL — ABNORMAL HIGH (ref 4.0–10.5)

## 2023-04-05 LAB — BASIC METABOLIC PANEL
BUN: 23 mg/dL (ref 6–23)
CO2: 25 mEq/L (ref 19–32)
Calcium: 9.4 mg/dL (ref 8.4–10.5)
Chloride: 103 mEq/L (ref 96–112)
Creatinine, Ser: 1.43 mg/dL — ABNORMAL HIGH (ref 0.40–1.20)
GFR: 40.88 mL/min — ABNORMAL LOW (ref >60.00)
Glucose, Bld: 92 mg/dL (ref 70–99)
Potassium: 3.4 mEq/L — ABNORMAL LOW (ref 3.5–5.1)
Sodium: 137 mEq/L (ref 135–145)

## 2023-04-05 NOTE — Patient Instructions (Signed)
Continue flomax and increase watr intae to help flush kidneys/bladder   If stone is still present and renal function has worsened will refer to urology for lithotripsy

## 2023-04-05 NOTE — Assessment & Plan Note (Addendum)
Plain films (KUB) ordered to  evaluate location of stone were not able to do so, likely  due to size of 3 mm  .  She has no hematuria on  today's UA ; culture is pending .  Repeat CT is needed to reassess for stone and hematuria / Referring to urology in the event that she is not able to pass the stone.

## 2023-04-05 NOTE — Assessment & Plan Note (Addendum)
Confirmed today.  Presumed secondary  to an  obstructing stone seen on outside  CT abdomen and  pelvis.  However her last  known Cr  was 0.7 in  2021  Lab Results  Component Value Date   CREATININE 1.43 (H) 04/05/2023

## 2023-04-05 NOTE — Progress Notes (Signed)
Subjective:  Patient ID: Kristina Hall, female    DOB: 1966/08/21  Age: 57 y.o. MRN: [id number]  CC: The primary encounter diagnosis was Acute renal failure, unspecified acute renal failure type (HCC). Diagnoses of Right kidney stone, Deep inguinal pain, right, and Ureteral stone with hydronephrosis were also pertinent to this visit.   HPI Kristina Hall [facility] presents for ER follow up  Chief Complaint  Patient presents with   Hospitalization Follow-up    ED follow up on Kidney stones   Kristina Hall is a 57 yr old female with no significaant PMH  was treated in an ER  in [place] on  Friday  June 28 for  sudden onset of right  sided back pain secondary to ureteral stone  with hydronephrosis.  Symptoms started 6 hours prior to presentation and followed 4 days of outside labor in 90+ degree weather .  She was evaluated with CT abd and pelvis.  Cr was 1.4 and UA was positive for  RBC's.  She was given Flomax and advised to increase her water intake , but she has been voiding small amounts .  Has had anorexia and nausea which is improving. But not resolved. . Right sided back pain has resolved;  the pain is now localized to just above her pubic bone.   Urine  has not been grossly bloody. Denies fevers.  Has been urinating in small amounts since this morning.  Has been using tylenol exclusively until this morning (she took one 200 mg tablet of ibuprofen )     Outpatient Medications Prior to Visit  Medication Sig Dispense Refill   acetaminophen (TYLENOL) 650 MG CR tablet Take 650 mg by mouth every 8 (eight) hours as needed for pain.     ALPRAZolam (XANAX) 0.25 MG tablet Take 1 tablet (0.25 mg total) by mouth 2 (two) times daily as needed for anxiety. 10 tablet 0   Calcium Carbonate-Vit Kristina-Min (CALCIUM 1200 PO) Take 1 tablet by mouth daily.     chlorpheniramine-HYDROcodone (TUSSIONEX) 10-8 MG/5ML 1/2 to 1 teaspoon q hs prn cough and congestion. 70 mL 0   fexofenadine (ALLEGRA ALLERGY) 60 MG  tablet Take 1 tablet (60 mg total) by mouth 2 (two) times daily. 30 tablet 1   Lactobacillus (PROBIOTIC ACIDOPHILUS PO) Take 1 capsule by mouth daily.     mometasone (NASONEX) 50 MCG/ACT nasal spray Place 2 sprays into the nose daily. 1 each 12   omeprazole (PRILOSEC) 40 MG capsule Take 1 capsule (40 mg total) by mouth daily. 30 capsule 3   tamsulosin (FLOMAX) 0.4 MG CAPS capsule Take 1 capsule by mouth every morning.     VYZULTA 0.024 % SOLN Apply 1 drop to eye at bedtime.     amoxicillin-clavulanate (AUGMENTIN) 875-125 MG tablet Take 1 tablet by mouth 2 (two) times daily. (Patient not taking: Reported on 04/05/2023) 20 tablet 0   predniSONE (DELTASONE) 10 MG tablet 6 tablets on Day 1 , then reduce by 1 tablet daily until gone (Patient not taking: Reported on 04/05/2023) 21 tablet 0   No facility-administered medications prior to visit.    Review of Systems;  Patient denies headache, fevers, malaise, unintentional weight loss, skin rash, eye pain, sinus congestion and sinus pain, sore throat, dysphagia,  hemoptysis , cough, dyspnea, wheezing, chest pain, palpitations, orthopnea, edema, abdominal pain, nausea, melena, diarrhea, constipation, flank pain, dysuria, hematuria, urinary  Frequency, nocturia, numbness, tingling, seizures,  Focal weakness, Loss of consciousness,  Tremor, insomnia, depression, anxiety, and suicidal  ideation.      Objective:  BP 128/74   Pulse 62   Ht 5\' 5"  (1.651 m)   Wt 213 lb 6.4 oz (96.8 kg)   SpO2 97%   BMI 35.51 kg/m   BP Readings from Last 3 Encounters:  04/05/23 128/74  12/25/22 118/78  07/09/22 122/74    Wt Readings from Last 3 Encounters:  04/05/23 213 lb 6.4 oz (96.8 kg)  12/25/22 209 lb 9.6 oz (95.1 kg)  07/09/22 211 lb 3.2 oz (95.8 kg)    Physical Exam Vitals reviewed.  Constitutional:      General: She is not in acute distress.    Appearance: Normal appearance. She is well-developed and normal weight. She is not ill-appearing,  toxic-appearing or diaphoretic.  HENT:     Head: Normocephalic.     Right Ear: Tympanic membrane, ear canal and external ear normal. There is no impacted cerumen.     Left Ear: Tympanic membrane, ear canal and external ear normal. There is no impacted cerumen.     Nose: Nose normal.     Mouth/Throat:     Mouth: Mucous membranes are moist.     Pharynx: Oropharynx is clear.  Eyes:     General: No scleral icterus.       Right eye: No discharge.        Left eye: No discharge.     Conjunctiva/sclera: Conjunctivae normal.     Pupils: Pupils are equal, round, and reactive to light.  Neck:     Thyroid: No thyromegaly.     Vascular: No carotid bruit or JVD.  Cardiovascular:     Rate and Rhythm: Normal rate and regular rhythm.     Heart sounds: Normal heart sounds.  Pulmonary:     Effort: Pulmonary effort is normal. No respiratory distress.     Breath sounds: Normal breath sounds.  Chest:  Breasts:    Breasts are symmetrical.     Right: No swelling, inverted nipple, mass, nipple discharge, skin change or tenderness.     Left: No swelling, inverted nipple, mass, nipple discharge, skin change or tenderness.  Abdominal:     General: Bowel sounds are normal.     Palpations: Abdomen is soft. There is no mass.     Tenderness: There is no abdominal tenderness. There is right CVA tenderness. There is no guarding or rebound.  Musculoskeletal:        General: Normal range of motion.     Cervical back: Normal range of motion and neck supple.  Lymphadenopathy:     Cervical: No cervical adenopathy.     Upper Body:     Right upper body: No supraclavicular, axillary or pectoral adenopathy.     Left upper body: No supraclavicular, axillary or pectoral adenopathy.  Skin:    General: Skin is warm and dry.  Neurological:     General: No focal deficit present.     Mental Status: She is alert and oriented to person, place, and time. Mental status is at baseline.  Psychiatric:        Mood and Affect:  Mood normal.        Behavior: Behavior normal.        Thought Content: Thought content normal.        Judgment: Judgment normal.     No results found for: "HGBA1C"  Lab Results  Component Value Date   CREATININE 1.43 (H) 04/05/2023   CREATININE 0.73 10/25/2019   CREATININE 0.84 01/08/2017  Lab Results  Component Value Date   WBC 11.1 (H) 04/05/2023   HGB 13.4 04/05/2023   HCT 40.3 04/05/2023   PLT 201.0 04/05/2023   GLUCOSE 92 04/05/2023   CHOL 186 11/08/2019   TRIG 35.0 11/08/2019   HDL 69.00 11/08/2019   LDLCALC 110 (H) 11/08/2019   ALT 9 01/08/2017   AST 16 01/08/2017   NA 137 04/05/2023   K 3.4 (L) 04/05/2023   CL 103 04/05/2023   CREATININE 1.43 (H) 04/05/2023   BUN 23 04/05/2023   CO2 25 04/05/2023   TSH 2.09 11/08/2019    DG Finger Thumb Right  Result Date: 02/04/2015 CLINICAL DATA:  Pain.  No reported injury.  Initial evaluation. EXAM: RIGHT THUMB 2+V COMPARISON:  None. FINDINGS: No acute bony or joint abnormality identified. No evidence of fracture or dislocation. IMPRESSION: No acute abnormality. Electronically Signed   By: Maisie Fus  Register   On: 02/04/2015 16:12    Assessment & Plan:  .Acute renal failure, unspecified acute renal failure type [facility]) Assessment & Plan: Confirmed today.  Presumed secondary  to an  obstructing stone seen on outside  CT abdomen and  pelvis.  However her last  known Cr  was 0.7 in  2021  Lab Results  Component Value Date   CREATININE 1.43 (H) 04/05/2023     Orders: -     Basic metabolic panel -     CBC with Differential/Platelet -     DG Abd 1 View; Future  Right kidney stone -     DG Abd 1 View; Future  Deep inguinal pain, right -     DG Abd 1 View; Future  Ureteral stone with hydronephrosis Assessment & Plan:  Plain films (KUB) ordered to  evaluate location of stone were not able to do so, likely  due to size of 3 mm  .  She has no hematuria on  today's UA ; culture is pending .  Repeat CT is needed to  reassess for stone and hematuria / Referring to urology in the event that she is not able to pass the stone.   Orders: -     CT RENAL STONE STUDY; Future     I provided 30 minutes of face-to-face time during this encounter reviewing patient's last visit with me, patient's  most recent visit with cardiology,  nephrology,  and neurology,  recent surgical and non surgical procedures, previous  labs and imaging studies, counseling on currently addressed issues,  and post visit ordering to diagnostics and therapeutics .   Follow-up: No follow-ups on file.   Sherlene Shams, MD

## 2023-04-06 LAB — URINE CULTURE
MICRO NUMBER:: 15147290
Result:: NO GROWTH
SPECIMEN QUALITY:: ADEQUATE

## 2023-04-06 NOTE — Progress Notes (Signed)
This encounter was created in error - please disregard.

## 2023-04-07 ENCOUNTER — Other Ambulatory Visit: Payer: Self-pay | Admitting: Internal Medicine

## 2023-04-07 DIAGNOSIS — N182 Chronic kidney disease, stage 2 (mild): Secondary | ICD-10-CM

## 2023-04-09 ENCOUNTER — Other Ambulatory Visit: Payer: Self-pay | Admitting: Internal Medicine

## 2023-04-19 ENCOUNTER — Other Ambulatory Visit (INDEPENDENT_AMBULATORY_CARE_PROVIDER_SITE_OTHER): Payer: 59

## 2023-04-19 DIAGNOSIS — N182 Chronic kidney disease, stage 2 (mild): Secondary | ICD-10-CM | POA: Diagnosis not present

## 2023-04-20 LAB — RENAL FUNCTION PANEL
Albumin: 4.1 g/dL (ref 3.5–5.2)
BUN: 23 mg/dL (ref 6–23)
CO2: 26 mEq/L (ref 19–32)
Calcium: 9.8 mg/dL (ref 8.4–10.5)
Chloride: 104 mEq/L (ref 96–112)
Creatinine, Ser: 1.02 mg/dL (ref 0.40–1.20)
GFR: 61.3 mL/min (ref >60.00)
Glucose, Bld: 94 mg/dL (ref 70–99)
Phosphorus: 4.2 mg/dL (ref 2.3–4.6)
Potassium: 3.9 mEq/L (ref 3.5–5.1)
Sodium: 138 mEq/L (ref 135–145)

## 2023-04-20 LAB — MICROALBUMIN / CREATININE URINE RATIO
Creatinine,U: 78.2 mg/dL
Microalb Creat Ratio: 0.9 mg/g (ref 0.0–30.0)
Microalb, Ur: <0.7 mg/dL (ref 0.0–1.9)

## 2023-04-21 ENCOUNTER — Encounter: Payer: Self-pay | Admitting: Internal Medicine

## 2023-04-21 ENCOUNTER — Ambulatory Visit: Payer: 59 | Admitting: Internal Medicine

## 2023-04-21 VITALS — BP 126/70 | HR 56 | Ht 65.0 in | Wt 208.8 lb

## 2023-04-21 DIAGNOSIS — E669 Obesity, unspecified: Secondary | ICD-10-CM

## 2023-04-21 DIAGNOSIS — R001 Bradycardia, unspecified: Secondary | ICD-10-CM | POA: Diagnosis not present

## 2023-04-21 DIAGNOSIS — Z1211 Encounter for screening for malignant neoplasm of colon: Secondary | ICD-10-CM

## 2023-04-21 DIAGNOSIS — N132 Hydronephrosis with renal and ureteral calculous obstruction: Secondary | ICD-10-CM

## 2023-04-21 DIAGNOSIS — Z1231 Encounter for screening mammogram for malignant neoplasm of breast: Secondary | ICD-10-CM

## 2023-04-21 DIAGNOSIS — N179 Acute kidney failure, unspecified: Secondary | ICD-10-CM | POA: Diagnosis not present

## 2023-04-21 DIAGNOSIS — Z90711 Acquired absence of uterus with remaining cervical stump: Secondary | ICD-10-CM | POA: Insufficient documentation

## 2023-04-21 MED ORDER — POTASSIUM BICARBONATE 25 MEQ PO TBEF: 25.0000 meq | EFFERVESCENT_TABLET | Freq: Two times a day (BID) | ORAL | 0 refills | Status: DC

## 2023-04-21 NOTE — Assessment & Plan Note (Addendum)
Secondary to menorrhagia,  in 2011.  ([place]).  No cervical  CA screening last  decade.  Referral discussed

## 2023-04-21 NOTE — Assessment & Plan Note (Signed)
Normal ZIO moniotr ([place])

## 2023-04-21 NOTE — Progress Notes (Signed)
Subjective:  Patient ID: Kristina Hall, female    DOB: 1966-04-15  Age: 57 y.o. MRN: [id number]  CC: The primary encounter diagnosis was Encounter for screening mammogram for malignant neoplasm of breast. Diagnoses of S/P laparoscopic supracervical hysterectomy, Colon cancer screening, Sinus bradycardia, Ureteral stone with hydronephrosis, Acute renal failure, unspecified acute renal failure type (HCC), and Obesity (BMI 30.0-34.9) were also pertinent to this visit.   HPI Kristina Hall [facility] presents for  Chief Complaint  Patient presents with   Medical Management of Chronic Issues   1) Recent episode of nephrolithiasis occurred while vacationing in [place]:  she was treated in a local ER .  CT noted a 3 mm obstructing stone in the distal right ureter  with  hydronephrosis and elevated Cr. Of 1.4   She  returned home and subsequently passed the stone    She reports no symptoms of pelvic pain or hematuria and has increased her water intake.    2)   Acute renal failure:  her GFR has improved since she passed the stone but has not returned to baseline noted in 2021.  However has not had  GFR evaluation in 3 years   3) Obesity:  she has been intentionally losing  weight with dietary changes    Outpatient Medications Prior to Visit  Medication Sig Dispense Refill   Calcium Carbonate-Vit D-Min (CALCIUM 1200 PO) Take 1 tablet by mouth daily.     dorzolamide (TRUSOPT) 2 % ophthalmic solution Place 1 drop into both eyes 2 (two) times daily.     fexofenadine (ALLEGRA ALLERGY) 60 MG tablet Take 1 tablet (60 mg total) by mouth 2 (two) times daily. 30 tablet 1   Lactobacillus (PROBIOTIC ACIDOPHILUS PO) Take 1 capsule by mouth daily.     mometasone (NASONEX) 50 MCG/ACT nasal spray Place 2 sprays into the nose daily. 1 each 12   omeprazole (PRILOSEC) 40 MG capsule TAKE 1 CAPSULE (40 MG TOTAL) BY MOUTH DAILY. 90 capsule 1   VYZULTA 0.024 % SOLN Apply 1 drop to eye at bedtime.      acetaminophen (TYLENOL) 650 MG CR tablet Take 650 mg by mouth every 8 (eight) hours as needed for pain. (Patient not taking: Reported on 04/21/2023)     ALPRAZolam (XANAX) 0.25 MG tablet Take 1 tablet (0.25 mg total) by mouth 2 (two) times daily as needed for anxiety. (Patient not taking: Reported on 04/21/2023) 10 tablet 0   chlorpheniramine-HYDROcodone (TUSSIONEX) 10-8 MG/5ML 1/2 to 1 teaspoon q hs prn cough and congestion. (Patient not taking: Reported on 04/21/2023) 70 mL 0   tamsulosin (FLOMAX) 0.4 MG CAPS capsule Take 1 capsule by mouth every morning. (Patient not taking: Reported on 04/21/2023)     No facility-administered medications prior to visit.    Review of Systems;  Patient denies headache, fevers, malaise, unintentional weight loss, skin rash, eye pain, sinus congestion and sinus pain, sore throat, dysphagia,  hemoptysis , cough, dyspnea, wheezing, chest pain, palpitations, orthopnea, edema, abdominal pain, nausea, melena, diarrhea, constipation, flank pain, dysuria, hematuria, urinary  Frequency, nocturia, numbness, tingling, seizures,  Focal weakness, Loss of consciousness,  Tremor, insomnia, depression, anxiety, and suicidal ideation.      Objective:  BP 126/70   Pulse (!) 56   Ht 5\' 5"  (1.651 m)   Wt 208 lb 12.8 oz (94.7 kg)   SpO2 97%   BMI 34.75 kg/m   BP Readings from Last 3 Encounters:  04/21/23 126/70  04/05/23 128/74  12/25/22 118/78  Wt Readings from Last 3 Encounters:  04/21/23 208 lb 12.8 oz (94.7 kg)  04/05/23 213 lb 6.4 oz (96.8 kg)  12/25/22 209 lb 9.6 oz (95.1 kg)    Physical Exam Vitals reviewed.  Constitutional:      General: She is not in acute distress.    Appearance: Normal appearance. She is normal weight. She is not ill-appearing, toxic-appearing or diaphoretic.  HENT:     Head: Normocephalic.  Eyes:     General: No scleral icterus.       Right eye: No discharge.        Left eye: No discharge.     Conjunctiva/sclera: Conjunctivae  normal.  Cardiovascular:     Rate and Rhythm: Normal rate and regular rhythm.     Heart sounds: Normal heart sounds.  Pulmonary:     Effort: Pulmonary effort is normal. No respiratory distress.     Breath sounds: Normal breath sounds.  Musculoskeletal:        General: Normal range of motion.  Skin:    General: Skin is warm and dry.  Neurological:     General: No focal deficit present.     Mental Status: She is alert and oriented to person, place, and time. Mental status is at baseline.  Psychiatric:        Mood and Affect: Mood normal.        Behavior: Behavior normal.        Thought Content: Thought content normal.        Judgment: Judgment normal.   No results found for: "HGBA1C"  Lab Results  Component Value Date   CREATININE 1.02 04/19/2023   CREATININE 1.43 (H) 04/05/2023   CREATININE 0.73 10/25/2019    Lab Results  Component Value Date   WBC 11.1 (H) 04/05/2023   HGB 13.4 04/05/2023   HCT 40.3 04/05/2023   PLT 201.0 04/05/2023   GLUCOSE 94 04/19/2023   CHOL 186 11/08/2019   TRIG 35.0 11/08/2019   HDL 69.00 11/08/2019   LDLCALC 110 (H) 11/08/2019   ALT 9 01/08/2017   AST 16 01/08/2017   NA 138 04/19/2023   K 3.9 04/19/2023   CL 104 04/19/2023   CREATININE 1.02 04/19/2023   BUN 23 04/19/2023   CO2 26 04/19/2023   TSH 2.09 11/08/2019   MICROALBUR <0.7 04/19/2023    DG Finger Thumb Right  Result Date: 02/04/2015 CLINICAL DATA:  Pain.  No reported injury.  Initial evaluation. EXAM: RIGHT THUMB 2+V COMPARISON:  None. FINDINGS: No acute bony or joint abnormality identified. No evidence of fracture or dislocation. IMPRESSION: No acute abnormality. Electronically Signed   By: Maisie Fus  Register   On: 02/04/2015 16:12    Assessment & Plan:  .Encounter for screening mammogram for malignant neoplasm of breast -     Digital Screening Mammogram, Left and Right; Future  S/P laparoscopic supracervical hysterectomy Assessment & Plan: Secondary to menorrhagia,  in  2011.  ([place]).  No cervical  CA screening last  decade.  Referral discussed    Colon cancer screening -     Cologuard  Sinus bradycardia Assessment & Plan: Normal ZIO moniotr ([place])   Ureteral stone with hydronephrosis Assessment & Plan: She has passed the stone based on resolution of symptoms.  Subsequent urinalysis notes uric acid crystals .  Rescripting potassium bicarbonate tablets to prevent recurrence.    Acute renal failure, unspecified acute renal failure type [facility]) Assessment & Plan: GFR has improved since passing the stone but as notreturned to  baseline GFR of 2021. Marland Kitchen  GFR is currently 61 ml/min.  Repeat in one month ;    Obesity (BMI 30.0-34.9) Assessment & Plan: I have congratulated her in reduction of   BMI and encouraged  Continued weight loss with goal of 10% of body weigh over the next 6 months using a low glycemic index diet and regular exercise a minimum of 5 days per week.     Other orders -     Potassium Bicarbonate; Take 1 tablet (25 mEq total) by mouth 2 (two) times daily.  Dispense: 60 tablet; Refill: 0    Follow-up: No follow-ups on file.   Sherlene Shams, MD

## 2023-04-21 NOTE — Patient Instructions (Addendum)
If back pain recurs,  use 1000 mg tylenol at bedtime  We can add gabapentin if we need to   Avoid aleve,  motrin,  advil,  ibuprofen,  naproxen   I am prescribing a potassium citrate supplement to take twice daily to prevent more uric acid kidney stones   Repeat UA/micro and BMET in one month

## 2023-04-22 ENCOUNTER — Telehealth: Payer: Self-pay

## 2023-04-22 DIAGNOSIS — N179 Acute kidney failure, unspecified: Secondary | ICD-10-CM

## 2023-04-22 NOTE — Telephone Encounter (Signed)
Labs ordered for lab appt.  

## 2023-04-23 DIAGNOSIS — E669 Obesity, unspecified: Secondary | ICD-10-CM | POA: Insufficient documentation

## 2023-04-23 NOTE — Assessment & Plan Note (Signed)
GFR has improved since passing the stone but as notreturned to baseline GFR of 2021. Marland Kitchen  GFR is currently 61 ml/min.  Repeat in one month ;

## 2023-04-23 NOTE — Assessment & Plan Note (Signed)
I have congratulated her in reduction of   BMI and encouraged  Continued weight loss with goal of 10% of body weigh over the next 6 months using a low glycemic index diet and regular exercise a minimum of 5 days per week.    

## 2023-04-23 NOTE — Assessment & Plan Note (Signed)
She has passed the stone based on resolution of symptoms.  Subsequent urinalysis notes uric acid crystals .  Rescripting potassium bicarbonate tablets to prevent recurrence.

## 2023-05-24 ENCOUNTER — Other Ambulatory Visit (INDEPENDENT_AMBULATORY_CARE_PROVIDER_SITE_OTHER): Payer: 59

## 2023-05-24 DIAGNOSIS — N179 Acute kidney failure, unspecified: Secondary | ICD-10-CM

## 2023-05-25 LAB — BASIC METABOLIC PANEL
BUN: 23 mg/dL (ref 6–23)
CO2: 27 meq/L (ref 19–32)
Calcium: 9.7 mg/dL (ref 8.4–10.5)
Chloride: 103 meq/L (ref 96–112)
Creatinine, Ser: 1.01 mg/dL (ref 0.40–1.20)
GFR: 61.98 mL/min (ref >60.00)
Glucose, Bld: 96 mg/dL (ref 70–99)
Potassium: 4 mEq/L (ref 3.5–5.1)
Sodium: 136 meq/L (ref 135–145)

## 2023-05-25 LAB — URINALYSIS, ROUTINE W REFLEX MICROSCOPIC
Bilirubin Urine: NEGATIVE
Hgb urine dipstick: NEGATIVE
Ketones, ur: NEGATIVE
Leukocytes,Ua: NEGATIVE
Nitrite: NEGATIVE
RBC / HPF: NONE SEEN (ref 0–?)
Specific Gravity, Urine: 1.025 (ref 1.000–1.030)
Total Protein, Urine: NEGATIVE
Urine Glucose: NEGATIVE
Urobilinogen, UA: 0.2 (ref 0.0–1.0)
pH: 6 (ref 5.0–8.0)

## 2023-05-29 ENCOUNTER — Other Ambulatory Visit: Payer: Self-pay | Admitting: Internal Medicine

## 2023-06-02 ENCOUNTER — Ambulatory Visit: Payer: 59 | Admitting: Podiatry

## 2023-06-09 ENCOUNTER — Other Ambulatory Visit: Payer: Self-pay

## 2023-06-09 MED ORDER — POTASSIUM BICARBONATE 25 MEQ PO TBEF: 25.0000 meq | EFFERVESCENT_TABLET | Freq: Two times a day (BID) | ORAL | 0 refills | Status: DC

## 2023-06-09 NOTE — Telephone Encounter (Signed)
Refilled: 04/21/2023 Last OV: 04/21/2023 Next OV: not scheduled   Pt wants to know if she needs to continue with this medication. She stated if she does can she switch to the pill.  She also stated that since she has been taking the potassium she has not had any more leg cramps.

## 2023-07-01 ENCOUNTER — Other Ambulatory Visit: Payer: Self-pay

## 2023-07-01 DIAGNOSIS — N132 Hydronephrosis with renal and ureteral calculous obstruction: Secondary | ICD-10-CM

## 2023-07-01 DIAGNOSIS — N179 Acute kidney failure, unspecified: Secondary | ICD-10-CM

## 2023-07-01 MED ORDER — POTASSIUM BICARBONATE 25 MEQ PO TBEF: 25.0000 meq | EFFERVESCENT_TABLET | Freq: Two times a day (BID) | ORAL | 0 refills | Status: DC

## 2023-07-01 NOTE — Telephone Encounter (Signed)
Pt requested a 90 day supply of the potassium supplement. Rx has been sent as a 90 day supply. Pt's last potassium lab was in August and it was 4.0. Does pt need to have a repeat potassium level checked?

## 2023-07-04 NOTE — Addendum Note (Signed)
Addended by: Sherlene Shams on: 07/04/2023 07:32 PM   Modules accepted: Orders

## 2023-07-05 ENCOUNTER — Other Ambulatory Visit (INDEPENDENT_AMBULATORY_CARE_PROVIDER_SITE_OTHER): Payer: 59

## 2023-07-05 DIAGNOSIS — N179 Acute kidney failure, unspecified: Secondary | ICD-10-CM | POA: Diagnosis not present

## 2023-07-05 DIAGNOSIS — N132 Hydronephrosis with renal and ureteral calculous obstruction: Secondary | ICD-10-CM

## 2023-07-05 NOTE — Telephone Encounter (Signed)
Pt is aware and will schedule lab appt.

## 2023-07-06 ENCOUNTER — Encounter: Payer: Self-pay | Admitting: Internal Medicine

## 2023-07-06 LAB — RENAL FUNCTION PANEL
Albumin: 4.1 g/dL (ref 3.5–5.2)
BUN: 19 mg/dL (ref 6–23)
CO2: 27 meq/L (ref 19–32)
Calcium: 9.5 mg/dL (ref 8.4–10.5)
Chloride: 103 meq/L (ref 96–112)
Creatinine, Ser: 1.04 mg/dL (ref 0.40–1.20)
GFR: 59.8 mL/min — ABNORMAL LOW (ref >60.00)
Glucose, Bld: 96 mg/dL (ref 70–99)
Phosphorus: 4.3 mg/dL (ref 2.3–4.6)
Potassium: 3.8 meq/L (ref 3.5–5.1)
Sodium: 138 meq/L (ref 135–145)

## 2023-07-07 ENCOUNTER — Telehealth: Payer: Self-pay | Admitting: Internal Medicine

## 2023-07-07 MED ORDER — POTASSIUM BICARBONATE 25 MEQ PO TBEF: 25.0000 meq | EFFERVESCENT_TABLET | Freq: Two times a day (BID) | ORAL | 1 refills | Status: DC

## 2023-07-07 NOTE — Telephone Encounter (Signed)
Medication has been refilled. Pt is aware.

## 2023-07-07 NOTE — Telephone Encounter (Signed)
Prescription Request  07/07/2023  LOV: 04/21/2023  What is the name of the medication or equipment? potassium bicarbonate (K-LYTE) 25 MEQ disintegrating tablet    Patient requesting a 90 day supply   Have you contacted your pharmacy to request a refill? No   Which pharmacy would you like this sent to?  [id number] [address] [place] [address] Phone: [phone] Fax: [phone]     Patient notified that their request is being sent to the clinical staff for review and that they should receive a response within 2 business days.   Please advise at Mobile [phone] (mobile)

## 2023-07-21 ENCOUNTER — Ambulatory Visit: Payer: 59 | Admitting: Podiatry

## 2023-07-21 ENCOUNTER — Encounter: Payer: Self-pay | Admitting: Podiatry

## 2023-07-21 VITALS — BP 121/79 | HR 57

## 2023-07-21 DIAGNOSIS — L603 Nail dystrophy: Secondary | ICD-10-CM | POA: Diagnosis not present

## 2023-07-21 DIAGNOSIS — M216X1 Other acquired deformities of right foot: Secondary | ICD-10-CM

## 2023-07-21 DIAGNOSIS — M2041 Other hammer toe(s) (acquired), right foot: Secondary | ICD-10-CM | POA: Diagnosis not present

## 2023-07-21 DIAGNOSIS — M7741 Metatarsalgia, right foot: Secondary | ICD-10-CM

## 2023-07-21 DIAGNOSIS — M7742 Metatarsalgia, left foot: Secondary | ICD-10-CM

## 2023-07-21 NOTE — Progress Notes (Signed)
Subjective:  Patient ID: Kristina Hall, female    DOB: [birth year],  MRN: [id number]  Chief Complaint  Patient presents with   Nail Problem    "This toenail is thick and I want to see if he can thin it out."   Numbness    "I get numbness and tingling underneath my toe.  It swells and gets puffy sometime."    57 y.o. female presents with the above complaint. History confirmed with patient.   Objective:  Physical Exam: warm, good capillary refill, no trophic changes or ulcerative lesions, normal DP and PT pulses, normal sensory exam, and elongated second toe compared to first and third ray, dystrophic second toenail.  Some tenderness plantar MTP, no evidence of neuroma, no instability  Radiographs: Multiple views x-ray of the right foot: Prior radiographs from 2022 show elongated second ray Assessment:   1. Metatarsalgia of both feet   2. Nail dystrophy   3. Plantar flexed metatarsal bone of right foot   4. Hammertoe of right foot      Plan:  Patient was evaluated and treated and all questions answered.   I debrided the thickened nail sharply with a nail nipper and thinned it with a rotary bur.  We discussed the contributing etiology of the development of the hammertoe as well as a elongated second ray causing pressure on the toenail.  We discussed surgical and nonsurgical treatment.  She is also developing tenderness within the joint itself, I recommended nonsurgical treatment with offloading with a custom molded foot orthosis.  She will be casted for these.  Long-term we also discussed surgical correction if it is not improving.  Return if symptoms worsen or fail to improve.

## 2023-08-20 ENCOUNTER — Ambulatory Visit: Payer: 59

## 2023-08-20 NOTE — Progress Notes (Signed)
 Orthotics   Patient was present and evaluated for Custom molded foot orthotics. Patient will benefit from CFO's to provide total contact to BIL MLA's helping to balance and distribute body weight more evenly across BIL feet helping to reduce plantar pressure and pain. Orthotic will also encourage FF / RF alignment  Patient was scanned today and will return for fitting upon receipt

## 2023-10-01 ENCOUNTER — Ambulatory Visit (INDEPENDENT_AMBULATORY_CARE_PROVIDER_SITE_OTHER): Payer: 59

## 2023-10-01 DIAGNOSIS — M2142 Flat foot [pes planus] (acquired), left foot: Secondary | ICD-10-CM

## 2023-10-01 DIAGNOSIS — M216X1 Other acquired deformities of right foot: Secondary | ICD-10-CM

## 2023-10-01 DIAGNOSIS — M2042 Other hammer toe(s) (acquired), left foot: Secondary | ICD-10-CM | POA: Diagnosis not present

## 2023-10-01 DIAGNOSIS — M2041 Other hammer toe(s) (acquired), right foot: Secondary | ICD-10-CM | POA: Diagnosis not present

## 2023-10-01 DIAGNOSIS — M7741 Metatarsalgia, right foot: Secondary | ICD-10-CM

## 2023-10-01 NOTE — Progress Notes (Signed)
Patient presents today to pick up custom molded foot orthotics, diagnosed with Metatarsalgia  by Dr. Lilian Kapur.   Orthotics were dispensed and fit was satisfactory. Reviewed instructions for break-in and wear. Written instructions given to patient.  Patient will follow up as needed.   Addison Bailey Cped, CFo, CFm

## 2023-10-01 NOTE — Progress Notes (Signed)
Inserts are in BTON

## 2023-12-23 ENCOUNTER — Other Ambulatory Visit: Payer: Self-pay | Admitting: Internal Medicine

## 2023-12-23 ENCOUNTER — Telehealth: Payer: Self-pay

## 2023-12-23 DIAGNOSIS — E876 Hypokalemia: Secondary | ICD-10-CM

## 2023-12-23 NOTE — Telephone Encounter (Signed)
 Was looking to see if lab order had been placed

## 2023-12-27 ENCOUNTER — Other Ambulatory Visit (INDEPENDENT_AMBULATORY_CARE_PROVIDER_SITE_OTHER)

## 2023-12-27 DIAGNOSIS — E876 Hypokalemia: Secondary | ICD-10-CM

## 2023-12-28 ENCOUNTER — Encounter: Payer: Self-pay | Admitting: Internal Medicine

## 2023-12-28 LAB — BASIC METABOLIC PANEL
BUN: 22 mg/dL (ref 6–23)
CO2: 28 meq/L (ref 19–32)
Calcium: 9.6 mg/dL (ref 8.4–10.5)
Chloride: 102 meq/L (ref 96–112)
Creatinine, Ser: 0.94 mg/dL (ref 0.40–1.20)
GFR: 67.28 mL/min (ref >60.00)
Glucose, Bld: 94 mg/dL (ref 70–99)
Potassium: 3.9 meq/L (ref 3.5–5.1)
Sodium: 137 meq/L (ref 135–145)

## 2024-01-03 ENCOUNTER — Ambulatory Visit: Admitting: Podiatry

## 2024-01-03 ENCOUNTER — Encounter: Payer: Self-pay | Admitting: Podiatry

## 2024-01-03 DIAGNOSIS — L84 Corns and callosities: Secondary | ICD-10-CM

## 2024-01-03 NOTE — Progress Notes (Signed)
  Subjective:  Patient ID: Kristina Hall, female    DOB: 05/18/1966,  MRN: [id number]  Chief Complaint  Patient presents with   Foot Pain    RM7: Lump on left toe next to big toe is sore    58 y.o. female presents with the above complaint. History confirmed with patient.  This started after her dog stepped on the toe recently  Objective:  Physical Exam: warm, good capillary refill, no trophic changes or ulcerative lesions, normal DP and PT pulses, normal sensory exam, and painful corn medial DIPJ left second toe.  No ecchymosis no pain with range of motion of the interphalangeal joints  Assessment:   1. Corn of toe      Plan:  Patient was evaluated and treated and all questions answered.  Doubtful of fracture.  Discussed the trauma may have caused a blister and aggravated a pre-existing lesion.  Debrided the lesion as a courtesy today.  Salicylic acid applied.  We discussed possibility of recurrence.  If recurrence would recommend an x-ray and evaluation of underlying bone for spurring and discuss surgical options.  Follow-up with me as needed.  Return if symptoms worsen or fail to improve.

## 2024-01-19 ENCOUNTER — Other Ambulatory Visit: Payer: Self-pay

## 2024-01-19 MED ORDER — MUPIROCIN 2 % EX OINT: 1.0000 | TOPICAL_OINTMENT | Freq: Two times a day (BID) | CUTANEOUS | 0 refills | Status: AC

## 2024-01-19 NOTE — Telephone Encounter (Signed)
 S/p dog scratch. No bite. Has been changing dressing and applying triple abx cream.  Will now leave uncovered. Apply bactroban as directed. Follow for swelling.

## 2024-01-19 NOTE — Telephone Encounter (Signed)
 Patient has scratches from her dog on her arm. Has  been putting triple abx ointment and covering with telfa and coband. Discussed with you this morning. Advised to leave uncovered. Pended rx for bactroban ointment per our discussion. Needs to be sent to [facility]

## 2024-02-07 ENCOUNTER — Ambulatory Visit (INDEPENDENT_AMBULATORY_CARE_PROVIDER_SITE_OTHER)

## 2024-02-07 ENCOUNTER — Ambulatory Visit: Admitting: Internal Medicine

## 2024-02-07 ENCOUNTER — Encounter: Payer: Self-pay | Admitting: Internal Medicine

## 2024-02-07 VITALS — BP 114/74 | HR 57 | Temp 98.0°F | Ht 65.0 in | Wt 172.6 lb

## 2024-02-07 DIAGNOSIS — R0789 Other chest pain: Secondary | ICD-10-CM | POA: Insufficient documentation

## 2024-02-07 NOTE — Assessment & Plan Note (Signed)
-   Patient presents today with acute pain over the left side of the sternum after hearing a pop while working with a gardening tool. -Pain is 5 out of 6 intensity, constant, worse with attempt to raise her left arm and laying flat as well as increased with cough and deep breaths -I suspect that the pain is likely secondary to musculoskeletal chest pain from a sprained intercostal muscle -Will obtain chest x-ray to rule out fracture (less likely -Would recommend a short course of NSAIDs (ibuprofen) for 5 to 7-day course -Patient was recommended topical OTC therapy with Bengay or IcyHot or Biofreeze -Recommend conservative measures including ice for the first 48 hours followed by warm compresses after the first 48 hours to help with pain healing -Return precautions given to the patient

## 2024-02-07 NOTE — Progress Notes (Signed)
 Acute Office Visit  Subjective:     Patient ID: Kristina Hall, female    DOB: 08-08-1966, 58 y.o.   MRN: [id number]  Chief Complaint  Patient presents with   Acute Visit    Left side rib pain from working in the garden Pain is from breast bone up around left breast toward the left shoulder Pain 5-6    HPI Patient is in today for musculoskeletal chest pain.  Patient states that she was out working in the garden and using a tool that you twist to help loosen weeds.  While using the tools she hit a hard patch of ground and heard a pop with some pain near the left side of her sternum.  Patient states that pain initially improved and she continued to work in the garden.  Later that day she took 1 ibuprofen because she was feeling some generalized ache after working in the garden which improved her symptoms.  She then picked up her 54-year-old grandson twice and felt recurrent pain over the area which is not subsided since.  He took 2 extra strength Tylenol this morning without any relief.    Pain is 5 out of 6 in intensity near the left side of her sternum radiating around her left breast.  Pain is worsened on laying flat as well as attempting to raise her left arm.  Pain is also worse with coughing and deep breaths.  Review of Systems  Constitutional: Negative.   HENT: Negative.    Respiratory: Negative.  Negative for cough and shortness of breath.   Cardiovascular:  Positive for chest pain. Negative for palpitations.  Musculoskeletal:        Pain over the left side of her sternum  Psychiatric/Behavioral: Negative.          Objective:    BP 114/74   Pulse (!) 57   Temp 98 F (36.7 C)   Ht 5\' 5"  (1.651 m)   Wt 172 lb 9.6 oz (78.3 kg)   SpO2 99%   BMI 28.72 kg/m    Physical Exam Constitutional:      Appearance: Normal appearance.  Cardiovascular:     Rate and Rhythm: Normal rate and regular rhythm.     Heart sounds: Normal heart sounds.  Pulmonary:      Breath sounds: Normal breath sounds. No wheezing or rales.  Musculoskeletal:     Comments: Moderate tenderness to palpation over the left side of her sternum.  No surrounding erythema or ecchymosis noted  Skin:    Findings: No rash.  Neurological:     Mental Status: She is alert and oriented to person, place, and time.  Psychiatric:        Mood and Affect: Mood normal.        Behavior: Behavior normal.     No results found for any visits on 02/07/24.      Assessment & Plan:   Problem List Items Addressed This Visit       Other   Chest pain, musculoskeletal - Primary   - Patient presents today with acute pain over the left side of the sternum after hearing a pop while working with a gardening tool. -Pain is 5 out of 6 intensity, constant, worse with attempt to raise her left arm and laying flat as well as increased with cough and deep breaths -I suspect that the pain is likely secondary to musculoskeletal chest pain from a sprained intercostal muscle -Will obtain chest x-ray to rule  out fracture (less likely -Would recommend a short course of NSAIDs (ibuprofen) for 5 to 7-day course -Patient was recommended topical OTC therapy with Bengay or IcyHot or Biofreeze -Recommend conservative measures including ice for the first 48 hours followed by warm compresses after the first 48 hours to help with pain healing -Return precautions given to the patient      Relevant Orders   DG Chest 2 View    No orders of the defined types were placed in this encounter.   No follow-ups on file.  Delsin Copen, MD

## 2024-02-07 NOTE — Patient Instructions (Addendum)
-   I am sorry that you are not feeling well -I suspect that your pain is likely secondary to a pulled muscle -We will obtain chest x-ray to rule out a fracture but this is less likely -I would advise using ibuprofen 400 mg every 6-8 hours as needed for 5 to 7 days -You can also use over-the-counter medications over the area like Bengay or IcyHot or Biofreeze to help with the pain -Please use ice over the area for the first 24 to 48 hours after which you can use heat over the area to promote healing -Please contact us  with any questions or concerns -Please follow-up with us  if your pain worsens or is not improving despite these measures

## 2024-04-05 ENCOUNTER — Other Ambulatory Visit

## 2024-04-05 ENCOUNTER — Other Ambulatory Visit: Payer: Self-pay | Admitting: Internal Medicine

## 2024-04-05 DIAGNOSIS — N179 Acute kidney failure, unspecified: Secondary | ICD-10-CM

## 2024-04-05 DIAGNOSIS — R0789 Other chest pain: Secondary | ICD-10-CM

## 2024-04-05 NOTE — Telephone Encounter (Signed)
 Patient notified

## 2024-04-06 ENCOUNTER — Other Ambulatory Visit

## 2024-04-06 ENCOUNTER — Ambulatory Visit: Payer: Self-pay | Admitting: Internal Medicine

## 2024-04-06 DIAGNOSIS — N179 Acute kidney failure, unspecified: Secondary | ICD-10-CM | POA: Diagnosis not present

## 2024-04-06 DIAGNOSIS — R0789 Other chest pain: Secondary | ICD-10-CM

## 2024-04-06 LAB — COMPREHENSIVE METABOLIC PANEL WITH GFR
AG Ratio: 1.9 (calc) (ref 1.0–2.5)
ALT: 13 U/L (ref 6–29)
AST: 12 U/L (ref 10–35)
Albumin: 4.1 g/dL (ref 3.6–5.1)
Alkaline phosphatase (APISO): 72 U/L (ref 37–153)
BUN: 24 mg/dL (ref 7–25)
CO2: 27 mmol/L (ref 20–32)
Calcium: 9.3 mg/dL (ref 8.6–10.4)
Chloride: 106 mmol/L (ref 98–110)
Creat: 0.93 mg/dL (ref 0.50–1.03)
Globulin: 2.2 g/dL (ref 1.9–3.7)
Glucose, Bld: 76 mg/dL (ref 65–99)
Potassium: 4.2 mmol/L (ref 3.5–5.3)
Sodium: 140 mmol/L (ref 135–146)
Total Bilirubin: 0.4 mg/dL (ref 0.2–1.2)
Total Protein: 6.3 g/dL (ref 6.1–8.1)
eGFR: 72 mL/min/{1.73_m2} (ref >=60)

## 2024-04-06 LAB — LIPID PANEL W/REFLEX DIRECT LDL
Cholesterol: 194 mg/dL (ref <200)
HDL: 76 mg/dL (ref >=50)
LDL Cholesterol (Calc): 104 mg/dL — ABNORMAL HIGH
Non-HDL Cholesterol (Calc): 118 mg/dL (ref <130)
Total CHOL/HDL Ratio: 2.6 (calc) (ref <5.0)
Triglycerides: 46 mg/dL (ref <150)

## 2024-04-06 NOTE — Addendum Note (Signed)
 Addended by: TANDA HARVEY D on: 04/06/2024 07:22 AM   Modules accepted: Orders

## 2024-08-23 ENCOUNTER — Telehealth: Payer: Self-pay

## 2024-08-23 DIAGNOSIS — E876 Hypokalemia: Secondary | ICD-10-CM

## 2024-08-23 DIAGNOSIS — R7301 Impaired fasting glucose: Secondary | ICD-10-CM

## 2024-08-23 DIAGNOSIS — R5383 Other fatigue: Secondary | ICD-10-CM

## 2024-08-23 DIAGNOSIS — E785 Hyperlipidemia, unspecified: Secondary | ICD-10-CM

## 2024-08-23 NOTE — Telephone Encounter (Signed)
 Spoke with pt and she stated that her rx potassium was stopped but she decided to start on an OTC potassium supplement and would like to have her labs checked. Labs have been pended for your approval. Let me know if needed and I will get pt scheduled for a lab appt.

## 2024-08-24 NOTE — Telephone Encounter (Signed)
 Pt is aware and will schedule a lab appt.

## 2024-08-28 ENCOUNTER — Other Ambulatory Visit

## 2024-08-28 DIAGNOSIS — R5383 Other fatigue: Secondary | ICD-10-CM

## 2024-08-28 DIAGNOSIS — R7301 Impaired fasting glucose: Secondary | ICD-10-CM | POA: Diagnosis not present

## 2024-08-28 DIAGNOSIS — E876 Hypokalemia: Secondary | ICD-10-CM | POA: Diagnosis not present

## 2024-08-28 DIAGNOSIS — E785 Hyperlipidemia, unspecified: Secondary | ICD-10-CM

## 2024-08-28 LAB — LDL CHOLESTEROL, DIRECT: Direct LDL: 95 mg/dL

## 2024-08-28 LAB — COMPREHENSIVE METABOLIC PANEL WITH GFR
ALT: 14 U/L (ref 0–35)
AST: 15 U/L (ref 0–37)
Albumin: 4.2 g/dL (ref 3.5–5.2)
Alkaline Phosphatase: 75 U/L (ref 39–117)
BUN: 16 mg/dL (ref 6–23)
CO2: 28 meq/L (ref 19–32)
Calcium: 9.3 mg/dL (ref 8.4–10.5)
Chloride: 105 meq/L (ref 96–112)
Creatinine, Ser: 0.87 mg/dL (ref 0.40–1.20)
GFR: 73.48 mL/min (ref >60.00)
Glucose, Bld: 86 mg/dL (ref 70–99)
Potassium: 4 meq/L (ref 3.5–5.1)
Sodium: 140 meq/L (ref 135–145)
Total Bilirubin: 0.6 mg/dL (ref 0.2–1.2)
Total Protein: 6.1 g/dL (ref 6.0–8.3)

## 2024-08-28 LAB — CBC WITH DIFFERENTIAL/PLATELET
Basophils Absolute: 0 K/uL (ref 0.0–0.1)
Basophils Relative: 0.5 % (ref 0.0–3.0)
Eosinophils Absolute: 0 K/uL (ref 0.0–0.7)
Eosinophils Relative: 0.5 % (ref 0.0–5.0)
HCT: 42.5 % (ref 36.0–46.0)
Hemoglobin: 14.3 g/dL (ref 12.0–15.0)
Lymphocytes Relative: 26 % (ref 12.0–46.0)
Lymphs Abs: 1.8 K/uL (ref 0.7–4.0)
MCHC: 33.6 g/dL (ref 30.0–36.0)
MCV: 91.9 fl (ref 78.0–100.0)
Monocytes Absolute: 0.5 K/uL (ref 0.1–1.0)
Monocytes Relative: 7.5 % (ref 3.0–12.0)
Neutro Abs: 4.4 K/uL (ref 1.4–7.7)
Neutrophils Relative %: 65.5 % (ref 43.0–77.0)
Platelets: 209 K/uL (ref 150.0–400.0)
RBC: 4.63 Mil/uL (ref 3.87–5.11)
RDW: 12.9 % (ref 11.5–15.5)
WBC: 6.7 K/uL (ref 4.0–10.5)

## 2024-08-28 LAB — LIPID PANEL
Cholesterol: 183 mg/dL (ref 0–200)
HDL: 66.1 mg/dL (ref >39.00)
LDL Cholesterol: 103 mg/dL — ABNORMAL HIGH (ref 0–99)
NonHDL: 117.06
Total CHOL/HDL Ratio: 3
Triglycerides: 69 mg/dL (ref 0.0–149.0)
VLDL: 13.8 mg/dL (ref 0.0–40.0)

## 2024-08-28 LAB — TSH: TSH: 2.41 u[IU]/mL (ref 0.35–5.50)

## 2024-08-28 LAB — HEMOGLOBIN A1C: Hgb A1c MFr Bld: 5.3 % (ref 4.6–6.5)

## 2024-08-29 ENCOUNTER — Ambulatory Visit: Payer: Self-pay | Admitting: Internal Medicine
